# Patient Record
Sex: Male | Born: 1970 | Race: Black or African American | Hispanic: No | Marital: Single | Smoking: Former smoker
Health system: Southern US, Community
[De-identification: ages and names within clinical notes are randomized; demographics above are authoritative.]

---

## 1998-08-28 ENCOUNTER — Emergency Department (HOSPITAL_COMMUNITY): Admission: EM | Admit: 1998-08-28 | Discharge: 1998-08-28 | Payer: Self-pay | Admitting: Emergency Medicine

## 1998-08-28 ENCOUNTER — Encounter: Payer: Self-pay | Admitting: Emergency Medicine

## 1998-09-08 ENCOUNTER — Emergency Department (HOSPITAL_COMMUNITY): Admission: EM | Admit: 1998-09-08 | Discharge: 1998-09-08 | Payer: Self-pay | Admitting: Emergency Medicine

## 2002-07-11 ENCOUNTER — Emergency Department (HOSPITAL_COMMUNITY): Admission: EM | Admit: 2002-07-11 | Discharge: 2002-07-11 | Payer: Self-pay | Admitting: *Deleted

## 2011-04-29 ENCOUNTER — Inpatient Hospital Stay (INDEPENDENT_AMBULATORY_CARE_PROVIDER_SITE_OTHER)
Admission: RE | Admit: 2011-04-29 | Discharge: 2011-04-29 | Disposition: A | Discharge status: Home / Self Care | Payer: Self-pay | Source: Ambulatory Visit | Attending: Emergency Medicine | Admitting: Emergency Medicine

## 2011-04-29 DIAGNOSIS — M549 Dorsalgia, unspecified: Secondary | ICD-10-CM

## 2011-04-29 DIAGNOSIS — M542 Cervicalgia: Secondary | ICD-10-CM

## 2011-07-28 ENCOUNTER — Emergency Department (INDEPENDENT_AMBULATORY_CARE_PROVIDER_SITE_OTHER)
Admission: EM | Admit: 2011-07-28 | Discharge: 2011-07-28 | Disposition: A | Discharge status: Home / Self Care | Payer: Self-pay | Source: Home / Self Care | Attending: Family Medicine | Admitting: Family Medicine

## 2011-07-28 ENCOUNTER — Encounter: Payer: Self-pay | Admitting: Emergency Medicine

## 2011-07-28 DIAGNOSIS — J069 Acute upper respiratory infection, unspecified: Secondary | ICD-10-CM

## 2011-07-28 DIAGNOSIS — H109 Unspecified conjunctivitis: Secondary | ICD-10-CM

## 2011-07-28 MED ORDER — TOBRAMYCIN 0.3 % OP SOLN: 1.0000 [drp] | Freq: Four times a day (QID) | OPHTHALMIC | Status: AC

## 2011-07-28 MED ORDER — IPRATROPIUM BROMIDE 0.06 % NA SOLN: 2.0000 | Freq: Four times a day (QID) | NASAL | Status: DC

## 2011-07-28 NOTE — ED Provider Notes (Signed)
History     CSN: [id number]  Arrival date & time 07/28/11  1035   First MD Initiated Contact with Patient 07/28/11 1046      Chief Complaint  Patient presents with  . Eye Pain    (Consider location/radiation/quality/duration/timing/severity/associated sxs/prior treatment) Patient is a 40 y.o. male presenting with eye pain. The history is provided by the patient.  Eye Pain This is a new problem. The current episode started 2 days ago. The problem occurs constantly. The problem has been gradually worsening. Associated symptoms comments: Nasal congestion. Treatments tried: used sustaine eye drops. The treatment provided no relief.    History reviewed. No pertinent past medical history.  History reviewed. No pertinent past surgical history.  No family history on file.  History  Substance Use Topics  . Smoking status: Current Everyday Smoker  . Smokeless tobacco: Not on file  . Alcohol Use: Yes      Review of Systems  Constitutional: Negative for fever.  HENT: Positive for congestion, rhinorrhea and postnasal drip. Negative for sore throat.   Eyes: Positive for discharge and redness. Negative for photophobia and pain.  Respiratory: Negative.     Allergies  Review of patient's allergies indicates no known allergies.  Home Medications   Current Outpatient Rx  Name Route Sig Dispense Refill  . IPRATROPIUM BROMIDE 0.06 % NA SOLN Nasal Place 2 sprays into the nose 4 (four) times daily. 15 mL 1  . TOBRAMYCIN SULFATE 0.3 % OP SOLN Both Eyes Place 1 drop into both eyes every 6 (six) hours. 5 mL 0    BP 118/75  Pulse 67  Temp(Src) 98.4 F (36.9 C) (Oral)  Resp 16  SpO2 99%  Physical Exam  Nursing note and vitals reviewed. Constitutional: He appears well-developed and well-nourished.  HENT:  Head: Normocephalic.  Right Ear: External ear normal.  Left Ear: External ear normal.  Mouth/Throat: Oropharynx is clear and moist.  Eyes: EOM and lids are normal. Pupils are  equal, round, and reactive to light. Right eye exhibits no discharge and no exudate. Left eye exhibits no discharge and no exudate. Right conjunctiva is injected. Right conjunctiva has no hemorrhage. Left conjunctiva is injected. Left conjunctiva has no hemorrhage.    ED Course  Procedures (including critical care time)  Labs Reviewed - No data to display No results found.   1. URI (upper respiratory infection)   2. Conjunctivitis of both eyes       MDM          Barkley Bruns, MD 07/28/11 1119

## 2011-07-28 NOTE — ED Notes (Signed)
Red eyes, initially right eye, now both.  Denies uri symptoms.  Eye redness started 07-25-11

## 2012-02-07 ENCOUNTER — Encounter (HOSPITAL_COMMUNITY): Payer: Self-pay

## 2012-02-07 ENCOUNTER — Emergency Department (INDEPENDENT_AMBULATORY_CARE_PROVIDER_SITE_OTHER)
Admission: EM | Admit: 2012-02-07 | Discharge: 2012-02-07 | Disposition: A | Discharge status: Home / Self Care | Payer: Self-pay | Source: Home / Self Care | Attending: Emergency Medicine | Admitting: Emergency Medicine

## 2012-02-07 DIAGNOSIS — J029 Acute pharyngitis, unspecified: Secondary | ICD-10-CM

## 2012-02-07 LAB — POCT RAPID STREP A: Streptococcus, Group A Screen (Direct): NEGATIVE

## 2012-02-07 MED ORDER — PREDNISONE 5 MG PO KIT: 1.0000 | PACK | Freq: Every day | ORAL | Status: DC

## 2012-02-07 MED ORDER — TRAMADOL HCL 50 MG PO TABS: 100.0000 mg | ORAL_TABLET | Freq: Three times a day (TID) | ORAL | Status: DC | PRN

## 2012-02-07 MED ORDER — NAPROXEN 500 MG PO TABS: 500.0000 mg | ORAL_TABLET | Freq: Two times a day (BID) | ORAL | Status: DC

## 2012-02-07 NOTE — ED Notes (Signed)
C/o sore throat since yesterday.  States it only hurts on the lt side and hurts up into his lt ear

## 2012-02-07 NOTE — ED Provider Notes (Signed)
Chief Complaint  Patient presents with  . Sore Throat    History of Present Illness:   Samuel Cole is a 41 year old male who has a three-day history of severe sore throat, pain on swallowing, pain radiating towards his left ear and left neck, swelling in the left side of the neck, chills, has felt feverish, had sweats, nasal congestion, and rhinorrhea. He's had a cough which is mostly dry but sometimes productive of small amounts of white sputum. He is a cigarette smoker.  Review of Systems:  Other than as noted above, the patient denies any of the following symptoms. Systemic:  No fever, chills, sweats, fatigue, myalgias, headache, or anorexia. Eye:  No redness, pain or drainage. ENT:  No earache, ear congestion, nasal congestion, sneezing, rhinorrhea, sinus pressure, sinus pain, or post nasal drip. Lungs:  No cough, sputum production, wheezing, shortness of breath, or chest pain. GI:  No abdominal pain, nausea, vomiting, or diarrhea. Skin:  No rash or itching.  PMFSH:  Past medical history, family history, social history, meds, allergies, and nurse's notes were reviewed.  There is no known exposure to strep or mono.  No prior history of step or mono.  The patient denies use of tobacco.  Physical Exam:   Vital signs:  BP 152/80  Pulse 76  Temp 100 F (37.8 C) (Oral)  Resp 20  SpO2 99% General:  Alert, in no distress. Eye:  No conjunctival injection or drainage. Lids were normal. ENT:  TMs and canals were normal, without erythema or inflammation.  Nasal mucosa was clear and uncongested, without drainage.  Mucous membranes were moist.  Exam of pharynx shows tonsils to be enlarged and red, more on the left than the right, without any exudate or displacement of the uvula.  There were no oral ulcerations or lesions. Neck:  Supple, no adenopathy, tenderness or mass. Lungs:  No respiratory distress.  Lungs were clear to auscultation, without wheezes, rales or rhonchi.  Breath sounds were clear and  equal bilaterally.  Heart:  Regular rhythm, without gallops, murmers or rubs. Skin:  Clear, warm, and dry, without rash or lesions.  Labs:   Results for orders placed during the hospital encounter of 02/07/12  POCT RAPID STREP A ([facility] ONLY)      Component Value Range   Streptococcus, Group A Screen (Direct) NEGATIVE  NEGATIVE    Assessment:  The encounter diagnosis was Pharyngitis.  Plan:   1.  The following meds were prescribed:   New Prescriptions   NAPROXEN (NAPROSYN) 500 MG TABLET    Take 1 tablet (500 mg total) by mouth 2 (two) times daily.   PREDNISONE 5 MG KIT    Take 1 kit (5 mg total) by mouth daily after breakfast. Prednisone 5 mg 6 day dosepack.  Take as directed.   TRAMADOL (ULTRAM) 50 MG TABLET    Take 2 tablets (100 mg total) by mouth every 8 (eight) hours as needed for pain.   2.  The patient was instructed in symptomatic care including hot saline gargles, throat lozenges, infectious precautions, and need to trade out toothbrush. Handouts were given. 3.  The patient was told to return if becoming worse in any way, if no better in 3 or 4 days, and given some red flag symptoms that would indicate earlier return.    Samuel Likes, MD 02/07/12 1323

## 2012-02-08 ENCOUNTER — Emergency Department (HOSPITAL_COMMUNITY): Payer: Self-pay

## 2012-02-08 ENCOUNTER — Inpatient Hospital Stay (HOSPITAL_COMMUNITY)
Admission: EM | Admit: 2012-02-08 | Discharge: 2012-02-14 | DRG: 152 | Disposition: A | Discharge status: Home / Self Care | Payer: Self-pay | Attending: Internal Medicine | Admitting: Internal Medicine

## 2012-02-08 ENCOUNTER — Encounter (HOSPITAL_COMMUNITY): Payer: Self-pay

## 2012-02-08 ENCOUNTER — Encounter (HOSPITAL_COMMUNITY): Payer: Self-pay | Admitting: Anesthesiology

## 2012-02-08 DIAGNOSIS — J9600 Acute respiratory failure, unspecified whether with hypoxia or hypercapnia: Secondary | ICD-10-CM | POA: Diagnosis present

## 2012-02-08 DIAGNOSIS — F1210 Cannabis abuse, uncomplicated: Secondary | ICD-10-CM | POA: Diagnosis present

## 2012-02-08 DIAGNOSIS — J189 Pneumonia, unspecified organism: Secondary | ICD-10-CM

## 2012-02-08 DIAGNOSIS — J9601 Acute respiratory failure with hypoxia: Secondary | ICD-10-CM

## 2012-02-08 DIAGNOSIS — J0510 Acute epiglottitis without obstruction: Principal | ICD-10-CM

## 2012-02-08 DIAGNOSIS — J029 Acute pharyngitis, unspecified: Secondary | ICD-10-CM | POA: Diagnosis present

## 2012-02-08 DIAGNOSIS — F17200 Nicotine dependence, unspecified, uncomplicated: Secondary | ICD-10-CM | POA: Diagnosis present

## 2012-02-08 DIAGNOSIS — E46 Unspecified protein-calorie malnutrition: Secondary | ICD-10-CM

## 2012-02-08 LAB — LACTIC ACID, PLASMA: Lactic Acid, Venous: 3 mmol/L — ABNORMAL HIGH (ref 0.5–2.2)

## 2012-02-08 LAB — CBC
MCH: 32.1 pg (ref 26.0–34.0)
MCHC: 35.1 g/dL (ref 30.0–36.0)
Platelets: 235 10*3/uL (ref 150–400)

## 2012-02-08 LAB — POCT I-STAT 3, ART BLOOD GAS (G3+)
TCO2: 24 mmol/L (ref 0–100)
pCO2 arterial: 43.9 mmHg (ref 35.0–45.0)
pH, Arterial: 7.316 — ABNORMAL LOW (ref 7.350–7.450)

## 2012-02-08 LAB — BASIC METABOLIC PANEL
Calcium: 9.6 mg/dL (ref 8.4–10.5)
GFR calc non Af Amer: >90 mL/min (ref >90)
Glucose, Bld: 172 mg/dL — ABNORMAL HIGH (ref 70–99)
Sodium: 136 mEq/L (ref 135–145)

## 2012-02-08 LAB — PROTIME-INR: Prothrombin Time: 14.6 seconds (ref 11.6–15.2)

## 2012-02-08 MED ORDER — MIDAZOLAM HCL 2 MG/2ML IJ SOLN: 3.0000 mg | Freq: Once | INTRAMUSCULAR | Status: DC

## 2012-02-08 MED ORDER — DEXTROSE 5 % IV SOLN
1.0000 g | Freq: Once | INTRAVENOUS | Status: AC
  Administered 2012-02-08: 1 g via INTRAVENOUS
  Filled 2012-02-08: qty 10

## 2012-02-08 MED ORDER — DEXTROSE 5 % IV SOLN
500.0000 mg | INTRAVENOUS | Status: DC
  Administered 2012-02-09 – 2012-02-13 (×5): 500 mg via INTRAVENOUS
  Filled 2012-02-08 (×6): qty 500

## 2012-02-08 MED ORDER — HEPARIN SODIUM (PORCINE) 5000 UNIT/ML IJ SOLN
5000.0000 [IU] | Freq: Three times a day (TID) | INTRAMUSCULAR | Status: DC
  Administered 2012-02-09 – 2012-02-14 (×15): 5000 [IU] via SUBCUTANEOUS
  Filled 2012-02-08 (×19): qty 1

## 2012-02-08 MED ORDER — MIDAZOLAM HCL 2 MG/2ML IJ SOLN
INTRAMUSCULAR | Status: AC
  Administered 2012-02-08: 3 mg via INTRAVENOUS
  Filled 2012-02-08: qty 4

## 2012-02-08 MED ORDER — RACEPINEPHRINE HCL 2.25 % IN NEBU
0.5000 mL | INHALATION_SOLUTION | Freq: Once | RESPIRATORY_TRACT | Status: AC
  Administered 2012-02-08: 0.5 mL via RESPIRATORY_TRACT

## 2012-02-08 MED ORDER — PROPOFOL 10 MG/ML IV EMUL
5.0000 ug/kg/min | INTRAVENOUS | Status: DC
  Administered 2012-02-08: 8 ug/kg/min via INTRAVENOUS
  Administered 2012-02-08: 5 ug/kg/min via INTRAVENOUS
  Filled 2012-02-08 (×3): qty 100

## 2012-02-08 MED ORDER — RACEPINEPHRINE HCL 2.25 % IN NEBU
INHALATION_SOLUTION | RESPIRATORY_TRACT | Status: AC
  Administered 2012-02-08: 18:00:00
  Filled 2012-02-08: qty 0.5

## 2012-02-08 MED ORDER — ETOMIDATE 2 MG/ML IV SOLN
INTRAVENOUS | Status: AC
  Administered 2012-02-08: 30 mg
  Filled 2012-02-08: qty 20

## 2012-02-08 MED ORDER — METHYLPREDNISOLONE SODIUM SUCC 125 MG IJ SOLR
INTRAMUSCULAR | Status: AC
  Administered 2012-02-08: 18:00:00
  Filled 2012-02-08: qty 2

## 2012-02-08 MED ORDER — LIDOCAINE HCL (CARDIAC) 20 MG/ML IV SOLN
INTRAVENOUS | Status: AC
  Administered 2012-02-08: 100 mg
  Filled 2012-02-08: qty 5

## 2012-02-08 MED ORDER — PROPOFOL 10 MG/ML IV EMUL
5.0000 ug/kg/min | INTRAVENOUS | Status: DC
  Administered 2012-02-09 (×2): 20 ug/kg/min via INTRAVENOUS
  Administered 2012-02-09: 10 ug/kg/min via INTRAVENOUS
  Administered 2012-02-09: 20 ug/kg/min via INTRAVENOUS
  Administered 2012-02-10: 50 ug/kg/min via INTRAVENOUS
  Administered 2012-02-10: 35 ug/kg/min via INTRAVENOUS
  Administered 2012-02-10: 50 ug/kg/min via INTRAVENOUS
  Administered 2012-02-10: 25 ug/kg/min via INTRAVENOUS
  Administered 2012-02-10: 20 ug/kg/min via INTRAVENOUS
  Administered 2012-02-11: 50 ug/kg/min via INTRAVENOUS
  Administered 2012-02-11: 25 ug/kg/min via INTRAVENOUS
  Administered 2012-02-11: 50 ug/kg/min via INTRAVENOUS
  Administered 2012-02-11 – 2012-02-12 (×2): 25 ug/kg/min via INTRAVENOUS
  Filled 2012-02-08 (×8): qty 100

## 2012-02-08 MED ORDER — ALBUTEROL SULFATE HFA 108 (90 BASE) MCG/ACT IN AERS: 4.0000 | INHALATION_SPRAY | RESPIRATORY_TRACT | Status: DC | PRN

## 2012-02-08 MED ORDER — CLINDAMYCIN PHOSPHATE 600 MG/4ML IJ SOLN
INTRAMUSCULAR | Status: AC
  Filled 2012-02-08: qty 4

## 2012-02-08 MED ORDER — FENTANYL CITRATE 0.05 MG/ML IJ SOLN
50.0000 ug | INTRAMUSCULAR | Status: DC | PRN
  Administered 2012-02-09 – 2012-02-12 (×8): 100 ug via INTRAVENOUS
  Filled 2012-02-08 (×10): qty 2

## 2012-02-08 MED ORDER — CLINDAMYCIN PHOSPHATE 600 MG/50ML IV SOLN
600.0000 mg | Freq: Once | INTRAVENOUS | Status: AC
  Administered 2012-02-08: 600 mg via INTRAVENOUS

## 2012-02-08 MED ORDER — METHYLPREDNISOLONE SODIUM SUCC 125 MG IJ SOLR: 125.0000 mg | Freq: Once | INTRAMUSCULAR | Status: DC

## 2012-02-08 MED ORDER — FENTANYL CITRATE 0.05 MG/ML IJ SOLN
INTRAMUSCULAR | Status: AC
  Filled 2012-02-08: qty 2

## 2012-02-08 MED ORDER — SUCCINYLCHOLINE CHLORIDE 20 MG/ML IJ SOLN
INTRAMUSCULAR | Status: AC
  Administered 2012-02-08: 125 mg
  Filled 2012-02-08: qty 10

## 2012-02-08 MED ORDER — SODIUM CHLORIDE 0.9 % IV BOLUS (SEPSIS)
2000.0000 mL | Freq: Once | INTRAVENOUS | Status: AC
  Administered 2012-02-08: 1000 mL via INTRAVENOUS

## 2012-02-08 MED ORDER — ETOMIDATE 2 MG/ML IV SOLN: 30.0000 mg | Freq: Once | INTRAVENOUS | Status: DC

## 2012-02-08 MED ORDER — PANTOPRAZOLE SODIUM 40 MG IV SOLR
40.0000 mg | Freq: Every day | INTRAVENOUS | Status: DC
  Administered 2012-02-09 – 2012-02-12 (×4): 40 mg via INTRAVENOUS
  Filled 2012-02-08 (×5): qty 40

## 2012-02-08 MED ORDER — METHYLPREDNISOLONE SODIUM SUCC 125 MG IJ SOLR
60.0000 mg | Freq: Two times a day (BID) | INTRAMUSCULAR | Status: DC
  Administered 2012-02-09 – 2012-02-12 (×9): 60 mg via INTRAVENOUS
  Filled 2012-02-08 (×4): qty 0.96
  Filled 2012-02-08: qty 2
  Filled 2012-02-08 (×6): qty 0.96

## 2012-02-08 MED ORDER — ROCURONIUM BROMIDE 50 MG/5ML IV SOLN
INTRAVENOUS | Status: AC
  Filled 2012-02-08: qty 2

## 2012-02-08 MED ORDER — SODIUM CHLORIDE 0.9 % IV BOLUS (SEPSIS)
1000.0000 mL | Freq: Once | INTRAVENOUS | Status: AC
  Administered 2012-02-08: 1000 mL via INTRAVENOUS

## 2012-02-08 MED ORDER — DEXTROSE 5 % IV SOLN
500.0000 mg | Freq: Once | INTRAVENOUS | Status: AC
  Administered 2012-02-08: 500 mg via INTRAVENOUS
  Filled 2012-02-08: qty 500

## 2012-02-08 MED ORDER — SUCCINYLCHOLINE CHLORIDE 20 MG/ML IJ SOLN
1.5000 mg/kg | Freq: Once | INTRAMUSCULAR | Status: DC
  Filled 2012-02-08: qty 5.96

## 2012-02-08 MED ORDER — SODIUM CHLORIDE 0.9 % IV SOLN
250.0000 mL | INTRAVENOUS | Status: DC | PRN
  Administered 2012-02-09: 1000 mL via INTRAVENOUS

## 2012-02-08 MED ORDER — DEXTROSE 5 % IV SOLN
INTRAVENOUS | Status: AC
  Filled 2012-02-08: qty 50

## 2012-02-08 MED ORDER — PROPOFOL 10 MG/ML IV EMUL: 5.0000 ug/kg/min | INTRAVENOUS | Status: DC

## 2012-02-08 NOTE — ED Notes (Signed)
Pt intubated and sats 98%.

## 2012-02-08 NOTE — ED Notes (Signed)
Pt back to ER in room Trauma B

## 2012-02-08 NOTE — ED Notes (Signed)
Dr Lew Dawes / Fonnie Jarvis request to go to ct. Ritter at bedside for transport. Trach and Intubation called for bedside. Nurse and RT travel with pt. Pt with increase in respiratory  distress RT at bedside. Bednar called to CT scan pt diaphoretic and SpO2 65 prepare to intubate.

## 2012-02-08 NOTE — ED Notes (Signed)
Pt calls out stating increase in shortness of breath. Dr Lew Dawes and Dr Fonnie Jarvis called to bedside. Resp called to bedside stat. Pt in tripod position diaphoretic. SpO2 100.

## 2012-02-08 NOTE — Progress Notes (Signed)
Called to bedside emergently for intubation of pt with suspected glottic swelling after attempt was made to visualize glottis in CT scan as pt became more stridorous.  Ventilated with mask with 100% O2, pt breathing but somnolent.   Glidescope to visualize glottis after Lidocaine 100 mg given IV.  Small opening seen with massive swelling of all tissue folds around vocal cords. 8.0 mm ETT passed with rotation through opening.  Pt coughed and there was immediate frothy red/pink return through the tube.  +CO2 by EZ Cap and BBS were heard.  Ventilation and sedation were turned over to the ED Staff.

## 2012-02-08 NOTE — ED Notes (Signed)
Sister to bedside and wants to remain at bedside with patient

## 2012-02-08 NOTE — ED Notes (Signed)
Suctioning--satss 93%.  Attempting intubation with use of Boogie.  Unsuccessful.  Continuing BVM ventilations

## 2012-02-08 NOTE — ED Provider Notes (Addendum)
History     CSN: [id number]  Arrival date & time 02/08/12  1519   First MD Initiated Contact with Patient 02/08/12 1524      Chief Complaint  Patient presents with  . Shortness of Breath    (Consider location/radiation/quality/duration/timing/severity/associated sxs/prior treatment) Patient is a 41 y.o. male presenting with shortness of breath. The history is provided by the patient.  Shortness of Breath  The current episode started today. The problem occurs frequently. The problem has been unchanged. The problem is moderate. Nothing relieves the symptoms. The symptoms are aggravated by a supine position. Associated symptoms include sore throat, cough and shortness of breath. Pertinent negatives include no chest pain, no chest pressure, no orthopnea, no fever, no rhinorrhea and no wheezing. There was no intake of a foreign body. He has not inhaled smoke recently. He has had no prior hospitalizations. He has had no prior ICU admissions. He has had no prior intubations. He has been behaving normally. Urine output has been normal. Recently, medical care has been given at this facility. Services received include medications given and tests performed.    History reviewed. No pertinent past medical history.  History reviewed. No pertinent past surgical history.  History reviewed. No pertinent family history.  History  Substance Use Topics  . Smoking status: Current Everyday Smoker -- 1.0 packs/day  . Smokeless tobacco: Not on file  . Alcohol Use: 0.6 oz/week    1 Cans of beer per week      Review of Systems  Constitutional: Negative for fever, activity change, appetite change and fatigue.  HENT: Positive for sore throat. Negative for congestion, facial swelling, rhinorrhea, trouble swallowing, neck pain, neck stiffness, voice change and sinus pressure.   Eyes: Negative.   Respiratory: Positive for cough and shortness of breath. Negative for choking, chest tightness and wheezing.     Cardiovascular: Negative for chest pain and orthopnea.  Gastrointestinal: Negative for nausea, vomiting and abdominal pain.  Genitourinary: Negative for dysuria, urgency, frequency, hematuria, flank pain and difficulty urinating.  Musculoskeletal: Negative for back pain and gait problem.  Skin: Negative for rash and wound.  Neurological: Negative for facial asymmetry, weakness, numbness and headaches.  Psychiatric/Behavioral: Negative for behavioral problems, confusion and agitation. The patient is not nervous/anxious and is not hyperactive.   All other systems reviewed and are negative.    Allergies  Review of patient's allergies indicates no known allergies.  Home Medications   Current Outpatient Rx  Name Route Sig Dispense Refill  . IBUPROFEN 200 MG PO TABS Oral Take 400 mg by mouth every 6 (six) hours as needed. For pain    . NAPROXEN 500 MG PO TABS Oral Take 500 mg by mouth 2 (two) times daily.    Marland Kitchen PREDNISONE 5 MG PO KIT Oral Take 1 kit by mouth daily after breakfast. Prednisone 5 mg 6 day dosepack.  Take as directed.    Marland Kitchen TRAMADOL HCL 50 MG PO TABS Oral Take 100 mg by mouth every 8 (eight) hours as needed. For pain      BP 135/76  Pulse 122  Temp 98.9 F (37.2 C) (Oral)  Resp 24  SpO2 100%  Physical Exam  Nursing note and vitals reviewed. Constitutional: He is oriented to person, place, and time. He appears well-developed and well-nourished. No distress.  HENT:  Head: Normocephalic and atraumatic.  Right Ear: External ear normal.  Left Ear: External ear normal.  Mouth/Throat: No oropharyngeal exudate, posterior oropharyngeal edema, posterior oropharyngeal erythema or tonsillar  abscesses.  Eyes: Conjunctivae and EOM are normal. Pupils are equal, round, and reactive to light. Right eye exhibits no discharge. Left eye exhibits no discharge.  Neck: Normal range of motion. Neck supple. No JVD present. No tracheal deviation present. No thyromegaly present.  Cardiovascular:  Normal rate, regular rhythm, normal heart sounds and intact distal pulses.  Exam reveals no gallop and no friction rub.   No murmur heard. Pulmonary/Chest: Effort normal and breath sounds normal. No respiratory distress. He has no wheezes. He exhibits no tenderness.  Abdominal: Soft. Bowel sounds are normal. He exhibits no distension. There is no tenderness. There is no rebound and no guarding.  Musculoskeletal: Normal range of motion. He exhibits no edema and no tenderness.  Lymphadenopathy:    He has no cervical adenopathy.  Neurological: He is alert and oriented to person, place, and time. No cranial nerve deficit.  Skin: Skin is warm and dry. No rash noted. He is not diaphoretic. No pallor.  Psychiatric: He has a normal mood and affect. His behavior is normal.    ED Course  INTUBATION Date/Time: 02/08/2012 7:10 PM Performed by: Sherryl Manges Authorized by: Sherryl Manges Consent: Verbal consent not obtained. The procedure was performed in an emergent situation. Patient identity confirmed: hospital-assigned identification number and arm band Time out: Immediately prior to procedure a "time out" was called to verify the correct patient, procedure, equipment, support staff and site/side marked as required. Indications: respiratory failure Intubation method: video-assisted Patient status: paralyzed (RSI) Preoxygenation: BVM Sedatives: etomidate Paralytic: succinylcholine Laryngoscope size: Mac 4 Tube size: 7.0 mm Tube type: cuffed Number of attempts: 3 Ventilation between attempts: BVM Cricoid pressure: no Cords visualized: no Comments: Failed attempt at intubation. It was successful by anesthesia. See their note   (including critical care time)   Labs Reviewed  CBC  BASIC METABOLIC PANEL   No results found.   No diagnosis found.    MDM  41 year old male with a noncontributory past medical history presents with shortness of breath. The patient was here yesterday with  a sore throat. Patient sore throat and feeling ill for the past 3 or 4 days. Patient says over the last 12 hours if having worsening shortness of breath. He denies nausea vomiting chest pain abdominal pain and has had low-grade fevers. Here patient making while sounds with inspiratory but has not musical note consistent with stridor at this point. Patient's pulse was 80 to 90s on my interview. Patient moving air well oxygenation is normal. Concern for possible retropharyngeal abscess or epiglottitis given difficulty breathing recent illness or throat pain. No torticollis or trismus. We'll get CT soft tissue of neck also get routine labs and give the patient fluid we'll monitor oxygenation and cardiac activity on monitors. At this time patient's otherwise stable.  Results for orders placed during the hospital encounter of 02/08/12  CBC      Component Value Range   WBC 26.7 (*) 4.0 - 10.5 K/uL   RBC 4.74  4.22 - 5.81 MIL/uL   Hemoglobin 15.2  13.0 - 17.0 g/dL   HCT 84.1  32.4 - 40.1 %   MCV 91.4  78.0 - 100.0 fL   MCH 32.1  26.0 - 34.0 pg   MCHC 35.1  30.0 - 36.0 g/dL   RDW 02.7  25.3 - 66.4 %   Platelets 235  150 - 400 K/uL  BASIC METABOLIC PANEL      Component Value Range   Sodium 136  135 - 145 mEq/L  Potassium 3.6  3.5 - 5.1 mEq/L   Chloride 99  96 - 112 mEq/L   CO2 23  19 - 32 mEq/L   Glucose, Bld 172 (*) 70 - 99 mg/dL   BUN 13  6 - 23 mg/dL   Creatinine, Ser 8.11  0.50 - 1.35 mg/dL   Calcium 9.6  8.4 - 91.4 mg/dL   GFR calc non Af Amer >90  >90 mL/min   GFR calc Af Amer >90  >90 mL/min  PRO B NATRIURETIC PEPTIDE      Component Value Range   Pro B Natriuretic peptide (BNP) 212.6 (*) 0 - 125 pg/mL  PROTIME-INR      Component Value Range   Prothrombin Time 14.6  11.6 - 15.2 seconds   INR 1.12  0.00 - 1.49  LACTIC ACID, PLASMA      Component Value Range   Lactic Acid, Venous 3.0 (*) 0.5 - 2.2 mmol/L  POCT I-STAT 3, BLOOD GAS (G3+)      Component Value Range   pH, Arterial 7.316  (*) 7.350 - 7.450   pCO2 arterial 43.9  35.0 - 45.0 mmHg   pO2, Arterial 75.0 (*) 80.0 - 100.0 mmHg   Bicarbonate 22.4  20.0 - 24.0 mEq/L   TCO2 24  0 - 100 mmol/L   O2 Saturation 93.0     Acid-base deficit 4.0 (*) 0.0 - 2.0 mmol/L   Collection site RADIAL, ALLEN'S TEST ACCEPTABLE     Drawn by Operator     Sample type ARTERIAL     DG Chest Portable 1 View (Final result)   Result time:02/08/12 1934    Final result by Rad Results In Interface (02/08/12 19:34:12)    Narrative:   *RADIOLOGY REPORT*  Clinical Data: Shortness of breath. Intubated.  PORTABLE CHEST - 1 VIEW  Comparison: None.  Findings: Endotracheal tube in satisfactory position. Mildly enlarged cardiac silhouette. Airspace opacity throughout the majority of both lungs. Gaseous distention of the stomach. Minimal scoliosis.  IMPRESSION:  1. Endotracheal tube in satisfactory position. 2. Mild cardiomegaly. 3. Diffuse bilateral pneumonia or alveolar edema. 4. Gaseous distention of the stomach.  Original Report Authenticated By: Darrol Angel, M.D.    Patient continues to feel difficulty with breathing but on reevaluation does not have stridor. Patient was moved to the CT scanner then in route started developing audible stridor and had appreciable shortness of breath. Patient was not able tolerate lying down for the CT scanner and started having decreased oxygen saturation. Oxygenation was maintained with bag mask assisted ventilation the patient. RSI was initiated in the CT scanner and a glidescope was used to attempt to intubate the patient. Patient had significant swelling in the back of his throat seen during laryngoscopy and landmarks were difficult to identify. Difficult to identify epiglottis and could not see cords on 2 different attempts. Could not pass a bougie. Patient was easily ventilated so ENT and anesthesia were consult and to help with airway management. Upon their arrival patient began awaking from RSI.  Patient was revisualized with Glidescope and out he was breathing it was easier to see his airway and he was intubated with a 7-0 tube. Patient was then seen by ENT and admitted to the ICU service after being put on a propofol drip for sedation. It is presumed the patient has some sort of infectious etiology causing swelling in his lower pharynx.  Case discussed with Dr.Bednar        Sherryl Manges, MD 02/08/12 [phone]  Sherryl Manges, MD 02/13/12 1534

## 2012-02-08 NOTE — ED Notes (Signed)
Continuing bag patient and plan for possible intubation or trach placement

## 2012-02-08 NOTE — ED Notes (Signed)
Anesthesia at bedside and attempting to intubate.  Sats 100% with BVM ventilations.

## 2012-02-08 NOTE — ED Notes (Signed)
Dr. Suszanne Conners ENT to bedside and Dr. Fonnie Jarvis speaking about patient case.

## 2012-02-08 NOTE — ED Notes (Signed)
Pt states symptom unresolved but have not worsened.

## 2012-02-08 NOTE — ED Notes (Signed)
Bagging patient and sats up to 100% and continuing to maintain airway.  bp 188/121 and HR 114.  Anesthesia is coming down to see patient now.

## 2012-02-08 NOTE — ED Notes (Signed)
Pt is brought back from CT after attempting to intubate, but unsuccessful and Dr. Fonnie Jarvis remains with patient and calling ENT now.  Anesthesia paged for standby for possible trach insertion or intubation.  Continuing to bag patient with BVM ventilations and sats 92%.

## 2012-02-08 NOTE — ED Notes (Signed)
Anesthesia paged

## 2012-02-08 NOTE — ED Notes (Addendum)
Critical care md in to assess pt.

## 2012-02-08 NOTE — ED Notes (Signed)
Attempting to intubate and sats 98% .  BP 155/88 and HR 71.  Pt has swollen airway.

## 2012-02-08 NOTE — ED Notes (Signed)
Pt went straight to CT with RN and called for possible intubation and sats dropping into the 50s.  Dr. Fonnie Jarvis bagging patient in CT and sats 100%, HR 134, BP---181/97.  Nurses and RT at bedside.

## 2012-02-08 NOTE — ED Notes (Addendum)
Per Ems pt dx with uri 2 days ago. Pt began having sob today ran to office of apartment building called 911. On arrival of fire department pts SpO2 80%. EMS states 100% on 2L [place]. Per EMS pt was in tripod position and diaphoretic.

## 2012-02-08 NOTE — ED Notes (Signed)
Pt remains on vent.  Will answer questions by shaking head.  Family at bedside.  Pt continues to be suctioned off and on.  Pink colored sputum suctioned from endo tube.  Pt has min. Amount of blood coming from right nare.  Admission bed obtained waiting for critical care MD to see pt.  Family updated on plan of care.

## 2012-02-08 NOTE — ED Notes (Signed)
Pt waking up and combative placed NRB on patient and md order Versed.  Pt on monitor with HR in the 70s.  Sats in the 40-50s and continuing to bag patient and sats are improving.  HR 120.  Sats 90% now.  Dr. Fonnie Jarvis speaking with Anesthesiology now.

## 2012-02-08 NOTE — ED Provider Notes (Signed)
I saw and evaluated the patient, reviewed the resident's note and I agree with the findings and plan.  OP clear, uvula midline, no drool/stridor, no neck LAN, L:CTA, RA sat 100%, but sensation of sore throat with odynophagia few days CT ordered.  ECG: Normal sinus rhythm, ventricular rate 89, normal axis, normal intervals, nonspecific ST-T changes with no comparison ECG available  Just prior to CT attempt Pt developed mild stridor and resp distress but L:CTA and RA sat 100% with pulse stable ~100, decision made to have Pt attempt CT with ED staff present in case of decompensation, Pt attempted to lie flat in CT dropped O2 sats to 50% briefly, became poorly responsive with severe resp distress, CT not attempted, BVM assisted resps sat improved to 100%, time-out taken, RSI attempt in CT video-laryngoscopy performed visualized suspected swollen glottis but due to distorted anatomy ETT/bougie not attempted to pass, Pt BVM with sats 97-100% moved back to main ED, ENT and Anesth called and arrived, Pt began to wake up and sats dropped again briefly so sedated with Versed and BVM assisted resps and sats high 90s, Anesth obtained same view as ED with Glidescope successfully attempted ETT passage (single attempt), positive ETCO2, maintained sats >90% during intubation, ENT present and Cric kit open and ready by EDP, family present during procedure.  EDP Procedures:Video-laryngoscopy, mechanical ventilation, critical care  Case d/w Anesth, ENT, Crit Care, family  CRITICAL CARE Performed by: Hurman Horn   Total critical care time:  Critical care time was exclusive of separately billable procedures and treating other patients.  Critical care was necessary to treat or prevent imminent or life-threatening deterioration.  Critical care was time spent personally by me on the following activities: development of treatment plan with patient and/or surrogate as well as nursing, discussions with  consultants, evaluation of patient's response to treatment, examination of patient, obtaining history from patient or surrogate, ordering and performing treatments and interventions, ordering and review of laboratory studies, ordering and review of radiographic studies, pulse oximetry and re-evaluation of patient's condition.  Hurman Horn, MD 02/10/12 1315

## 2012-02-08 NOTE — ED Notes (Signed)
Anesthesia attempting intubation now.

## 2012-02-08 NOTE — ED Notes (Signed)
Bagging patient with BVM and sats 96%.

## 2012-02-08 NOTE — Consult Note (Signed)
Reason for Consult: Respiratory distress Referring Physician: Dr. Fonnie Jarvis  HPI:  Samuel Cole is an 41 y.o. male who presents to the [facility] emergency room today complaining of shortness of breath and sore throat. The patient was transported to the emergency room by an ambulance, after the patient called 911 secondary to shortness of breath. According to the sister, the patient has been experiencing sore throat for the past 3 days. It has progressively worsened. While in the CT suite, the patient decompensated, and became unresponsive. Initial attempts to intubate the patient was unsuccessful secondary to severe laryngeal edema. The patient was finally intubated on second attempt. The patient has no previous history of head and neck pathology. He has no previous history of ENT surgery. The sister reports that the patient's immunizations are all up-to-date. No previous history of significant tonsillitis or epiglottitis. The patient was previously healthy.  History reviewed. No pertinent past medical history.  History reviewed. No pertinent past surgical history.  History reviewed. No pertinent family history.  Social History:  reports that he has been smoking.  He does not have any smokeless tobacco history on file. He reports that he drinks about .6 ounces of alcohol per week. He reports that he uses illicit drugs (Marijuana) about once per week.  Allergies: No Known Allergies  Medications: I have reviewed the patient's current medications. Prior to Admission:  (Not in a hospital admission)  Results for orders placed during the hospital encounter of 02/08/12 (from the past 48 hour(s))  CBC     Status: Abnormal   Collection Time   02/08/12  3:59 PM      Component Value Range Comment   WBC 26.7 (*) 4.0 - 10.5 K/uL    RBC 4.74  4.22 - 5.81 MIL/uL    Hemoglobin 15.2  13.0 - 17.0 g/dL    HCT 16.1  09.6 - 04.5 %    MCV 91.4  78.0 - 100.0 fL    MCH 32.1  26.0 - 34.0 pg    MCHC 35.1  30.0 -  36.0 g/dL    RDW 40.9  81.1 - 91.4 %    Platelets 235  150 - 400 K/uL   BASIC METABOLIC PANEL     Status: Abnormal   Collection Time   02/08/12  3:59 PM      Component Value Range Comment   Sodium 136  135 - 145 mEq/L    Potassium 3.6  3.5 - 5.1 mEq/L    Chloride 99  96 - 112 mEq/L    CO2 23  19 - 32 mEq/L    Glucose, Bld 172 (*) 70 - 99 mg/dL    BUN 13  6 - 23 mg/dL    Creatinine, Ser 7.82  0.50 - 1.35 mg/dL    Calcium 9.6  8.4 - 95.6 mg/dL    GFR calc non Af Amer >90  >90 mL/min    GFR calc Af Amer >90  >90 mL/min     Blood pressure 115/86, pulse 119, temperature 98.9 F (37.2 C), temperature source Oral, resp. rate 22, weight 79.379 kg (175 lb), SpO2 98.00%.  Physical examination: The patient is intubated and unresponsive. He appears well-nourished and well-developed. Eye: No conjunctival injection or drainage. Lids were normal. His pupils are equal, round, reactive to light. Extraocular motion cannot be determined. Examination of the ears shows normal auricles and external auditory canals bilaterally. Nasal examination shows normal mucosa, septum, turbinates. Facial examination shows no asymmetry. Oral cavity examination  shows no mucosal lacerations. Uvula midline. Oropharyngeal erythema/edema. Palpation of the neck reveals no significant lymphadenopathy or mass. The trachea is midline. The thyroid is not significantly enlarged. Cranial nerves could not be ascertained.  Skin: Skin is warm and dry. No rash noted. He is not diaphoretic. No pallor.   Procedure:  Flexible Fiberoptic Laryngoscopy Anesthesia: None Indication: Respiratory distress, laryngeal and epiglottic swelling. Description:  The nasal cavities were decongested and anesthetised with a combination of oxymetazoline and 4% lidocaine solution.  The flexible scope was inserted into the right nasal cavity and advanced towards the nasopharynx.  Visualized mucosa over the turbinates and septum were normal.  The nasopharynx was  clear.  Oropharyngeal walls edematous and erythematous. Epiglottis and the entire larynx is diffusely edematous and erythematous. ET tube was in place.  Assessment/Plan: Acute pharyngitis with epiglottitis and severe laryngeal edema. Pt now intubated. Recommend broad spectrum antibiotic coverage and IV steroid.  Will follow.  Arvella Massingale,SUI W 02/08/2012, 7:00 PM

## 2012-02-08 NOTE — ED Notes (Signed)
Family at bedside pt will open eyes and answer questions appropriately by shaking his head.  NS bolus infusing at this time.

## 2012-02-08 NOTE — ED Notes (Signed)
Pt arrives c/o sob related to sore throat. Pt has small amount of stridor noted. Controlling own sections. Thick white coating noted to tongue. Pt able to speak 3-5 word answers. Voice with deep thick sound. Dr. Lew Dawes at bedside. Skin warm and dry. Pt sitting in semi fowlers position without difficulty.

## 2012-02-08 NOTE — ED Notes (Signed)
Bagging patient

## 2012-02-09 ENCOUNTER — Inpatient Hospital Stay (HOSPITAL_COMMUNITY): Payer: Self-pay

## 2012-02-09 DIAGNOSIS — J189 Pneumonia, unspecified organism: Secondary | ICD-10-CM

## 2012-02-09 DIAGNOSIS — J9601 Acute respiratory failure with hypoxia: Secondary | ICD-10-CM

## 2012-02-09 DIAGNOSIS — J9600 Acute respiratory failure, unspecified whether with hypoxia or hypercapnia: Secondary | ICD-10-CM

## 2012-02-09 DIAGNOSIS — J0510 Acute epiglottitis without obstruction: Secondary | ICD-10-CM

## 2012-02-09 LAB — MRSA PCR SCREENING: MRSA by PCR: NEGATIVE

## 2012-02-09 LAB — BASIC METABOLIC PANEL
Calcium: 8.2 mg/dL — ABNORMAL LOW (ref 8.4–10.5)
GFR calc non Af Amer: >90 mL/min (ref >90)
Glucose, Bld: 249 mg/dL — ABNORMAL HIGH (ref 70–99)
Sodium: 133 mEq/L — ABNORMAL LOW (ref 135–145)

## 2012-02-09 LAB — BLOOD GAS, ARTERIAL
Acid-base deficit: 5.3 mmol/L — ABNORMAL HIGH (ref 0.0–2.0)
Bicarbonate: 19 mEq/L — ABNORMAL LOW (ref 20.0–24.0)
FIO2: 0.4 %
MECHVT: 530 mL
TCO2: 20 mmol/L (ref 0–100)
pCO2 arterial: 34.5 mmHg — ABNORMAL LOW (ref 35.0–45.0)

## 2012-02-09 LAB — CBC
MCH: 32 pg (ref 26.0–34.0)
Platelets: 228 10*3/uL (ref 150–400)
Platelets: 211 10*3/uL (ref 150–400)
RBC: 4.81 MIL/uL (ref 4.22–5.81)
RBC: 4.37 MIL/uL (ref 4.22–5.81)
RDW: 12.3 % (ref 11.5–15.5)
WBC: 32.5 10*3/uL — ABNORMAL HIGH (ref 4.0–10.5)
WBC: 28.5 10*3/uL — ABNORMAL HIGH (ref 4.0–10.5)

## 2012-02-09 LAB — GLUCOSE, CAPILLARY: Glucose-Capillary: 176 mg/dL — ABNORMAL HIGH (ref 70–99)

## 2012-02-09 LAB — CREATININE, SERUM
Creatinine, Ser: 0.98 mg/dL (ref 0.50–1.35)
GFR calc Af Amer: >90 mL/min (ref >90)

## 2012-02-09 LAB — HIV ANTIBODY (ROUTINE TESTING W REFLEX): HIV: NONREACTIVE

## 2012-02-09 MED ORDER — MIDAZOLAM HCL 2 MG/2ML IJ SOLN
1.0000 mg | INTRAMUSCULAR | Status: DC | PRN
  Administered 2012-02-08: 3 mg via INTRAVENOUS
  Administered 2012-02-09 (×2): 1 mg via INTRAVENOUS

## 2012-02-09 MED ORDER — IOHEXOL 300 MG/ML  SOLN
75.0000 mL | Freq: Once | INTRAMUSCULAR | Status: AC | PRN
  Administered 2012-02-09: 75 mL via INTRAVENOUS

## 2012-02-09 MED ORDER — MIDAZOLAM HCL 2 MG/2ML IJ SOLN
1.0000 mg | INTRAMUSCULAR | Status: DC | PRN
Start: 2012-02-09 — End: 2012-02-13

## 2012-02-09 MED ORDER — VANCOMYCIN HCL IN DEXTROSE 1-5 GM/200ML-% IV SOLN
1000.0000 mg | Freq: Three times a day (TID) | INTRAVENOUS | Status: DC
  Administered 2012-02-09 – 2012-02-11 (×8): 1000 mg via INTRAVENOUS
  Filled 2012-02-09 (×10): qty 200

## 2012-02-09 MED ORDER — MIDAZOLAM HCL 2 MG/2ML IJ SOLN
INTRAMUSCULAR | Status: AC
  Administered 2012-02-09: 1 mg via INTRAVENOUS
  Filled 2012-02-09: qty 2

## 2012-02-09 MED ORDER — PIPERACILLIN-TAZOBACTAM 3.375 G IVPB 30 MIN
3.3750 g | INTRAVENOUS | Status: AC
  Administered 2012-02-09: 3.375 g via INTRAVENOUS
  Filled 2012-02-09: qty 50

## 2012-02-09 MED ORDER — VANCOMYCIN HCL IN DEXTROSE 1-5 GM/200ML-% IV SOLN
1000.0000 mg | INTRAVENOUS | Status: AC
  Administered 2012-02-09: 1000 mg via INTRAVENOUS
  Filled 2012-02-09: qty 200

## 2012-02-09 MED ORDER — LORAZEPAM 2 MG/ML IJ SOLN
0.5000 mg | INTRAMUSCULAR | Status: DC | PRN
  Administered 2012-02-09 – 2012-02-10 (×3): 1 mg via INTRAVENOUS
  Filled 2012-02-09 (×2): qty 1

## 2012-02-09 MED ORDER — SODIUM CHLORIDE 0.9 % IV BOLUS (SEPSIS)
500.0000 mL | Freq: Once | INTRAVENOUS | Status: AC
  Administered 2012-02-09: 500 mL via INTRAVENOUS

## 2012-02-09 MED ORDER — CHLORHEXIDINE GLUCONATE 0.12 % MT SOLN
OROMUCOSAL | Status: AC
  Filled 2012-02-09: qty 15

## 2012-02-09 MED ORDER — PIPERACILLIN-TAZOBACTAM 3.375 G IVPB
3.3750 g | Freq: Three times a day (TID) | INTRAVENOUS | Status: DC
  Administered 2012-02-09 – 2012-02-12 (×12): 3.375 g via INTRAVENOUS
  Filled 2012-02-09 (×14): qty 50

## 2012-02-09 MED ORDER — BIOTENE DRY MOUTH MT LIQD
15.0000 mL | Freq: Four times a day (QID) | OROMUCOSAL | Status: DC
  Administered 2012-02-09 – 2012-02-13 (×15): 15 mL via OROMUCOSAL

## 2012-02-09 MED ORDER — CHLORHEXIDINE GLUCONATE 0.12 % MT SOLN
15.0000 mL | Freq: Two times a day (BID) | OROMUCOSAL | Status: DC
  Administered 2012-02-09 – 2012-02-12 (×8): 15 mL via OROMUCOSAL
  Filled 2012-02-09 (×8): qty 15

## 2012-02-09 NOTE — Progress Notes (Signed)
Chaplain called to ED for family support.  Sister was in Trauma B with patient, very focused on his care and not needing support.   Within 5 minutes, chaplain found that patient mother & other sister were sitting in 17B.  Chaplain went to give support, encouragement, spiritual conversation, offer comfort measures.  Chaplain accompanied family to visit with patient after intubation.  Mother very emotional.    Chaplain checked in with family throughout evening..and gave encouragement before patient was moved to 2100.  Rev. Audie Box [phone]

## 2012-02-09 NOTE — Progress Notes (Signed)
INITIAL ADULT NUTRITION ASSESSMENT Date: 02/09/2012   Time: 11:15 AM Reason for Assessment: Ventilated pt   ASSESSMENT: Male 41 y.o.  Dx: Shortness of breath  Intervention: Recommend Oxepa start at 35ml/hr increase by 10ml q4hr to goal of 23ml/hr which will provide 1800 calories, 75g protein, and free water. This TF plus current rate of propofol would provide a total of 2051 calories. If IVF d/c recommend water flushes q4hr.   Hx:  History reviewed. No pertinent past medical history.  Related Meds:  Scheduled Meds:   . antiseptic oral rinse  15 mL Mouth Rinse QID  . azithromycin  500 mg Intravenous Once  . azithromycin  500 mg Intravenous Q24H  . cefTRIAXone (ROCEPHIN) IVPB 1 gram/50 mL D5W  1 g Intravenous Once  . chlorhexidine  15 mL Mouth Rinse BID  . clindamycin      . clindamycin (CLEOCIN) IV  600 mg Intravenous Once  . dextrose      . etomidate      . etomidate  30 mg Intravenous Once  . heparin  5,000 Units Subcutaneous Q8H  . lidocaine (cardiac) 100 mg/48ml      . methylPREDNISolone (SOLU-MEDROL) injection  125 mg Intravenous Once  . methylPREDNISolone (SOLU-MEDROL) injection  60 mg Intravenous Q12H  . methylPREDNISolone sodium succinate      . midazolam  3 mg Intravenous Once  . pantoprazole (PROTONIX) IV  40 mg Intravenous QHS  . piperacillin-tazobactam  3.375 g Intravenous NOW  . piperacillin-tazobactam (ZOSYN)  IV  3.375 g Intravenous Q8H  . Racepinephrine HCl  0.5 mL Nebulization Once  . Racepinephrine HCl      . rocuronium      . sodium chloride  1,000 mL Intravenous Once  . sodium chloride  2,000 mL Intravenous Once  . sodium chloride  500 mL Intravenous Once  . succinylcholine      . succinylcholine  1.5 mg/kg Intravenous Once  . vancomycin  1,000 mg Intravenous NOW  . vancomycin  1,000 mg Intravenous Q8H   Continuous Infusions:   . propofol 8 mcg/kg/min (02/08/12 1911)  . propofol 20 mcg/kg/min (02/09/12 0800)  . DISCONTD: propofol      PRN Meds:.sodium chloride, albuterol, fentaNYL, LORazepam, midazolam, DISCONTD: midazolam  Ht: 5\' 10"  (177.8 cm)  Wt: 156 lb 1.4 oz (70.8 kg)  Ideal Wt: 166 lb % Ideal Wt: 94  Usual Wt: 156 lb % Usual Wt: 100  Body mass index is 22.40 kg/(m^2).  Food/Nutrition Related Hx: Pt admitted with 3 day history of severe throat pain and pain with swallowing. Pt developed shortness of breath and became unresponsive requiring intubation. Currently no plans for extubation. Met with pt's sister at bedside who reports pt typically eats well with good appetite and no changes in weight, however pt's intake decline to minimal snacking for the past 3 days r/t pain with swallowing.   MVe = 10.4 L/min Maximum temperature in the past 24 hours of 37.9 degrees Celsius  Propofol running at 9.5 ml/min, provides 251 calories   Labs:  CMP     Component Value Date/Time   NA 133* 02/09/2012 0400   K 4.2 02/09/2012 0400   CL 104 02/09/2012 0400   CO2 19 02/09/2012 0400   GLUCOSE 249* 02/09/2012 0400   BUN 13 02/09/2012 0400   CREATININE 0.97 02/09/2012 0400   CALCIUM 8.2* 02/09/2012 0400   GFRNONAA >90 02/09/2012 0400   GFRAA >90 02/09/2012 0400    Intake/Output Summary (Last 24 hours) at  02/09/12 1119 Last data filed at 02/09/12 0800  Gross per 24 hour  Intake 1517.74 ml  Output    550 ml  Net 967.74 ml   Last BM - PTA  Diet Order:  NPO   IVF:    propofol Last Rate: 8 mcg/kg/min (02/08/12 1911)  propofol Last Rate: 20 mcg/kg/min (02/09/12 0800)  DISCONTD: propofol     Estimated Nutritional Needs:   Kcal: 2004 Protein: 70-85g Fluid: 2L  NUTRITION DIAGNOSIS: -Inadequate oral intake (NI-2.1).  Status: Ongoing  RELATED TO: inability to eat  AS EVIDENCE BY: mechanical ventilation, NPO  MONITORING/EVALUATION(Goals): Enteral nutrition to meet >90% of estimated nutritional needs.   EDUCATION NEEDS: -No education needs identified at this time  INTERVENTION: RN stated pt likely to remain intubated  over the weekend. Recommend Oxepa start at 56ml/hr increase by 10ml q4hr to goal of 20ml/hr which will provide 1800 calories, 75g protein, and free water. This TF plus current rate of propofol would provide a total of 2051 calories. If IVF d/c recommend water flushes q4hr.    Dietitian #: [phone]  DOCUMENTATION CODES Per approved criteria  -Not Applicable    Samuel Cole, Samuel Cole 02/09/2012, 11:15 AM

## 2012-02-09 NOTE — H&P (Addendum)
Name: Samuel Cole MRN: [id number] DOB: 1971-06-03    LOS: 1  PULMONARY / CRITICAL CARE MEDICINE  HPI:   41 year old male with no significant PMH except for marijuana abuse. Presents with 3 to 4 days history of sore throat, non productive cough, odynophagia, fever, chills and malaise. He came to the ED yesterday and was diagnosed with pharyngitis and was sent home on prednisone, naproxen and tramadol. Over the last 24 hrs his symptoms progressed and became SOB with stridor. 911 was called and was brought to the ED on supplemental O2. At admission there was no stridor and was saturating 100% on RA. He was sent for a CT of the neck and while on the CT he developed stridor, hypoxemia and became unconscious. The first intubation attempt in the CT scan was unsuccessful but they were able to ventilate him well with the bag. He was taken back to the ER and was intubated by anesthesia en ENT. Apparently there was severe epiglottis and laryngeal edema with purulent secretions. Once intubated the patient remained hemodynamically stable. At the time of my examination the patient is sedated with propofol but arousable and able to follow commands.    History reviewed. No pertinent past medical history. History reviewed. No pertinent past surgical history. Prior to Admission medications   Medication Sig Start Date End Date Taking? Authorizing Provider  ibuprofen (ADVIL,MOTRIN) 200 MG tablet Take 400 mg by mouth every 6 (six) hours as needed. For pain   Yes Historical Provider, MD  naproxen (NAPROSYN) 500 MG tablet Take 500 mg by mouth 2 (two) times daily. 02/07/12 02/06/13 Yes Reuben Likes, MD  PredniSONE 5 MG KIT Take 1 kit by mouth daily after breakfast. Prednisone 5 mg 6 day dosepack.  Take as directed. 02/07/12  Yes Reuben Likes, MD  traMADol (ULTRAM) 50 MG tablet Take 100 mg by mouth every 8 (eight) hours as needed. For pain 02/07/12 02/17/12 Yes Reuben Likes, MD   Allergies No Known Allergies  Family  History History reviewed. No pertinent family history. Social History  reports that he has been smoking.  He does not have any smokeless tobacco history on file. He reports that he drinks about .6 ounces of alcohol per week. He reports that he uses illicit drugs (Marijuana) about once per week.  Review Of Systems:  Unable to provide.   Vital Signs: Temp:  [98.9 F (37.2 C)-99.9 F (37.7 C)] 99.9 F (37.7 C) (07/06 0100) Pulse Rate:  [82-138] 82  (07/06 0100) Resp:  [22-30] 30  (07/06 0100) BP: (104-188)/(72-121) 104/72 mmHg (07/06 0100) SpO2:  [93 %-100 %] 93 % (07/06 0100) FiO2 (%):  [100 %] 100 % (07/06 0035) Weight:  [175 lb (79.379 kg)] 175 lb (79.379 kg) (07/05 1900)  Physical Examination: General:  Intubated, mechanically ventilated, no acute distress Neuro:  Sedated but arousable and able to follow commands, synchronous, nonfocal HEENT:  PERRL, pink conjunctivae, moist membranes Neck:  Supple, no JVD   Cardiovascular:  RRR, no M/R/G Lungs:  CTA, no W/R/R Abdomen:  Soft, nontender, nondistended, bowel sounds present Musculoskeletal:  Moves all extremities, no pedal edema Skin:  No rash    ASSESSMENT AND PLAN 41 year old male with no significant PMH. Presents with 4 days history of severe sore throat, fever, chills, odynophagia. In the ER he developed stridor and hypoxemia that required intubation for airway protection. Laryngoscopy revealed severely swollen epiglottis and larynx. There were purulent secretions. The chest X ray shows bilateral  infiltrates compatible with pneumonia. Less likely non cardiogenic pulmonary edema. Blood work showed markedly elevated WBC.  PULMONARY  Lab 02/08/12 2000  PHART 7.316*  PCO2ART 43.9  PO2ART 75.0*  HCO3 22.4  O2SAT 93.0   Ventilator Settings: Vent Mode:  [-] PRVC FiO2 (%):  [100 %] 100 % Set Rate:  [16 bmp-18 bmp] 16 bmp Vt Set:  [530 mL] 530 mL PEEP:  [5 cmH20] 5 cmH20 Plateau Pressure:  [16 cmH20-19 cmH20] 16  cmH20 CXR:  Bilateral infiltrates, likely pneumonia but non cardiogenic pulmonary edema also in the differential.  ETT:  7 cm from the carina. Needs to be advanced 3 to 4 cm.  A:   1) Acute respiratory failure 2) Epiglottitis and laryngitis with severe edema compromising the airway. 3) Community acquired pneumonia.  P:   1) Intubated and mechanically ventilated - PRVC, Vt 8cc/kg, PEEP 5, RR: 16, titrate FiO2 to keep O2 sat >94% - VAP prevention orderset. - Zosyn, vancomycin, and azithromycin. - Solumedrol 60 mg IV BID - Trach aspirate cultures, blood cultures, urine cultures. - Respiratory viral panel. - Urine drug screen.  - ENT input appreciated. - Got about 6 L of IV fluids in the ED  CARDIOVASCULAR  Lab 02/08/12 2049 02/08/12 2048  TROPONINI -- --  LATICACIDVEN 3.0* --  PROBNP -- 212.6*   ECG:  Normal sinus rhythm Lines: Peripheral lines  A:  No issues P:  - Consider echocardiogram if persistent fever, positive blood cultures or worsening lung infiltrates.   RENAL  Lab 02/08/12 1559  NA 136  K 3.6  CL 99  CO2 23  BUN 13  CREATININE 0.89  CALCIUM 9.6  MG --  PHOS --   Intake/Output    None    Foley:  Placed today (02/08/12)  A:   Normal kidney function P:   Monitor BMP  GASTROINTESTINAL No results found for this basename: AST:5,ALT:5,ALKPHOS:5,BILITOT:5,PROT:5,ALBUMIN:5 in the last 168 hours  A:   No issues P:   - GI prophylaxis with protonix IV - NPO  HEMATOLOGIC  Lab 02/09/12 0039 02/08/12 2049 02/08/12 1559  HGB 15.4 -- 15.2  HCT 44.3 -- 43.3  PLT 228 -- 235  INR -- 1.12 --  APTT -- -- --   A:   No issues except for elevated WBC P:  - Follow up CBC  INFECTIOUS  Lab 02/09/12 0039 02/08/12 1559  WBC 32.5* 26.7*  PROCALCITON -- --   Cultures: - Blood, urine and tracheal aspirates cultures ordered. Antibiotics: - Zosyn (02/08/12) - Vancomycin (02/08/12) - Azithromycin (02/08/12)  A:   1) Epiglottitis and laryngitis 2)  Community acquired pneumonia P:   - Antibiotics as above - Will follow cultures, RVP, HIV.  ENDOCRINE  Lab 02/09/12 0041  GLUCAP 176*   A:   No issues     NEUROLOGIC  A:   No issues P:   - Continuous sedation with propofol - Fentanyl PRN for ain control - Daily awakening.  BEST PRACTICE / DISPOSITION - Level of Care:  ICU - Primary Service:  CCM - Consultants:  ENT - Code Status:  Full code - Diet:  NPO - DVT Px:  Heparin - GI Px:  Protonix - Skin Integrity:  Intact - Social / Family:  Updated at bedside.  The patient is critically ill with multiple organ systems failure and requires high complexity decision making for assessment and support, frequent evaluation and titration of therapies, application of advanced monitoring technologies and extensive interpretation of multiple databases.  Critical Care Time devoted to patient care services described in this note is: 1 Hour  Overton Mam, M.D. Pulmonary and Critical Care Medicine [facility] Pager: [phone]  02/09/2012, 1:14 AM

## 2012-02-09 NOTE — Progress Notes (Signed)
ANTIBIOTIC CONSULT NOTE - INITIAL  Pharmacy Consult for Vancocin/Zosyn Indication: pneumonia  No Known Allergies  Patient Measurements: Weight: 175 lb (79.379 kg)  Vital Signs: Temp: 98.9 F (37.2 C) (07/05 1526) Temp src: Oral (07/05 1526) BP: 115/86 mmHg (07/05 1858) Pulse Rate: 119  (07/05 1858)  Labs:  Basename 02/08/12 1559  WBC 26.7*  HGB 15.2  PLT 235  LABCREA --  CREATININE 0.89   Microbiology: No results found for this or any previous visit (from the past 720 hour(s)).  Medical History: History reviewed. No pertinent past medical history.  Assessment: 41yo male seen in ED yesterday for pharyngitis c/o worsening SOB, returned today via EMS, became unresponsive in CT with difficulty intubating due to severe laryngeal edema, now to begin IV ABX for PNA.  Goal of Therapy:  Vancomycin trough level 15-20 mcg/ml  Plan:  Will begin vancomycin 1000mg  IV Q8H and Zosyn 3.375g IV Q8H and monitor CBC, Cx, levels prn.  Colleen Can PharmD BCPS 02/09/2012,12:24 AM

## 2012-02-09 NOTE — H&P (Signed)
Name: Samuel Cole MRN: [id number] DOB: 08-Dec-1970    LOS: 1  PULMONARY / CRITICAL CARE MEDICINE  HPI:   41 year old male with no significant PMH except for marijuana abuse. Presents with 3 to 4 days history of sore throat, non productive cough, odynophagia, fever, chills and malaise. He came to the ED yesterday and was diagnosed with pharyngitis and was sent home on prednisone, naproxen and tramadol. Over the last 24 hrs his symptoms progressed and became SOB with stridor. 911 was called and was brought to the ED on supplemental O2. At admission there was no stridor and was saturating 100% on RA. He was sent for a CT of the neck and while on the CT he developed stridor, hypoxemia and became unconscious. The first intubation attempt in the CT scan was unsuccessful but they were able to ventilate him well with the bag. He was taken back to the ER and was intubated by anesthesia and ENT. Apparently there was severe epiglottis and laryngeal edema with purulent secretions. Once intubated the patient remained hemodynamically stable. At the time of my examination the patient is sedated with propofol but arousable and able to follow commands.     Vital Signs: Temp:  [98.9 F (37.2 C)-100.4 F (38 C)] 99.4 F (37.4 C) (07/06 0805) Pulse Rate:  [71-138] 83  (07/06 0805) Resp:  [16-36] 23  (07/06 0805) BP: (88-188)/(59-121) 103/65 mmHg (07/06 0805) SpO2:  [85 %-100 %] 100 % (07/06 0805) FiO2 (%):  [40 %-100 %] 50 % (07/06 0805) Weight:  [156 lb 1.4 oz (70.8 kg)-175 lb (79.379 kg)] 156 lb 1.4 oz (70.8 kg) (07/06 0100)  Physical Examination: General:  Intubated, mechanically ventilated, no acute distress Neuro:  Awake, able to follow commands, synchronous, nonfocal HEENT:  PERRL, pink conjunctivae, moist membranes Neck:  Supple, no JVD   Cardiovascular:  RRR, no M/R/G Lungs:  CTA, no W/R/R Abdomen:  Soft, nontender, nondistended, bowel sounds present Musculoskeletal:  Moves all extremities, no  pedal edema Skin:  No rash    ASSESSMENT AND PLAN 41 year old male with no significant PMH. Presents with 4 days history of severe sore throat, fever, chills, odynophagia. In the ER he developed stridor and hypoxemia that required intubation for airway protection. Laryngoscopy revealed severely swollen epiglottis and larynx. There were purulent secretions. The chest X ray shows bilateral infiltrates compatible with pneumonia. Less likely non cardiogenic pulmonary edema. Blood work showed markedly elevated WBC.  PULMONARY  Lab 02/09/12 0450 02/08/12 2000  PHART 7.365 7.316*  PCO2ART 34.5* 43.9  PO2ART 67.5* 75.0*  HCO3 19.0* 22.4  O2SAT 92.6 93.0   Ventilator Settings: Vent Mode:  [-] PRVC FiO2 (%):  [40 %-100 %] 50 % Set Rate:  [14 bmp-18 bmp] 14 bmp Vt Set:  [530 mL] 530 mL PEEP:  [5 cmH20] 5 cmH20 Plateau Pressure:  [16 cmH20-21 cmH20] 20 cmH20 CXR:  Bilateral infiltrates, likely pneumonia but non cardiogenic pulmonary edema also in the differential.  ETT:  7/5>>>  A:   1) Acute respiratory failure 2) Epiglottitis and laryngitis with severe edema compromising the airway. 3) Community acquired pneumonia.  P:   - PRVC, Vt 8cc/kg, PEEP 5, RR: 16, titrate FiO2 to keep O2 sat >94%-->allow wean if comfortable but no plan for extubation.  Will need cuff leak etc prior to extubation.  - VAP precautions - see ID - Solumedrol 60 mg IV BID - Trach aspirate cultures, blood cultures, urine cultures. - Respiratory viral panel. - Urine drug  screen.  - ENT input appreciated. - CT neck without any airspace around ETT in pharynx, no intubation for at least 24 hours, likely Monday 7/8   CARDIOVASCULAR  Lab 02/08/12 2049 02/08/12 2048  TROPONINI -- --  LATICACIDVEN 3.0* --  PROBNP -- 212.6*   ECG:  Normal sinus rhythm Lines: Peripheral lines  A:  No issues P:  - Consider echocardiogram if persistent fever, positive blood cultures or worsening lung infiltrates.   RENAL  Lab  02/09/12 0400 02/09/12 0039 02/08/12 1559  NA 133* -- 136  K 4.2 -- 3.6  CL 104 -- 99  CO2 19 -- 23  BUN 13 -- 13  CREATININE 0.97 0.98 0.89  CALCIUM 8.2* -- 9.6  MG -- -- --  PHOS -- -- --   Intake/Output      07/05 0701 - 07/06 0700 07/06 0701 - 07/07 0700   I.V. (mL/kg) 689.4 (9.7) 53.3 (0.8)   IV Piggyback 550 225   Total Intake(mL/kg) 1239.4 (17.5) 278.3 (3.9)   Urine (mL/kg/hr) 550 (0.3)    Total Output 550    Net +689.4 +278.3         Foley:  7/5>>>  A:   Normal kidney function P:   Monitor BMP  GASTROINTESTINAL No results found for this basename: AST:5,ALT:5,ALKPHOS:5,BILITOT:5,PROT:5,ALBUMIN:5 in the last 168 hours  A:   No issues P:   - GI prophylaxis with protonix IV - NPO  HEMATOLOGIC  Lab 02/09/12 0400 02/09/12 0039 02/08/12 2049 02/08/12 1559  HGB 14.0 15.4 -- 15.2  HCT 39.9 44.3 -- 43.3  PLT 211 228 -- 235  INR -- -- 1.12 --  APTT -- -- -- --   A:   No issues except for elevated WBC P:  - Follow up CBC  INFECTIOUS  Lab 02/09/12 0400 02/09/12 0039 02/08/12 1559  WBC 28.5* 32.5* 26.7*  PROCALCITON -- -- --   Cultures: BCx2 7/5>>> UC 7/5>>> Resp Culture 7/5>>>  Antibiotics: - Zosyn (02/08/12) - Vancomycin (02/08/12) - Azithromycin (02/08/12)  A:   Epiglottitis and laryngitis Community acquired pneumonia  P:   - Antibiotics as above - Will follow cultures, RVP, HIV. - CT neck to eval for abscess, discuss with ENT  ENDOCRINE  Lab 02/09/12 0041  GLUCAP 176*   A:   No issues     NEUROLOGIC  A:   No issues P:   - Continuous sedation with propofol - Fentanyl PRN for ain control - Daily awakening.   BEST PRACTICE / DISPOSITION - Level of Care:  ICU - Primary Service:  CCM - Consultants:  ENT - Code Status:  Full code - Diet:  NPO - DVT Px:  Heparin - GI Px:  Protonix - Skin Integrity:  Intact - Social / Family:  Updated at bedside.  Canary Brim, NP-C [facility] Pgr: [phone] or  [phone]  I have seen and examined the patient with nurse practitioner/resident and agree with the note above.   Yolonda Kida PCCM Pager: [phone] Cell: [phone] If no response, call [phone]

## 2012-02-09 NOTE — ED Provider Notes (Signed)
I saw and evaluated the patient, reviewed the resident's note and I agree with the findings and plan.  Videolaryngoscopy performed by ED resident and attending simultaneously while Pt in CT area (looked twice), bougie guided toward posterior pharynx but with distorted landmarks did not attempt to pass bougie or ETT in CT area since able to ventilate BVM without difficulty at time and moved Pt back to main ED due to suspected swollen larynx obstructing glottic view, anesthesia attending passed tube in ED with ED resident and ED attending present and assisting and ENT present, only one attempt to pass device through glottis was made successfully by anesthesia.  Hurman Horn, MD 02/09/12 [phone]

## 2012-02-09 NOTE — Progress Notes (Signed)
Subjective: Pt sedated and intubated. Not responsive.   Objective: Vital signs in last 24 hours: Temp:  [98.9 F (37.2 C)-100.4 F (38 C)] 99.8 F (37.7 C) (07/06 0700) Pulse Rate:  [71-138] 83  (07/06 0700) Resp:  [16-36] 22  (07/06 0700) BP: (88-188)/(59-121) 103/64 mmHg (07/06 0700) SpO2:  [85 %-100 %] 100 % (07/06 0700) FiO2 (%):  [40 %-100 %] 100 % (07/06 0630) Weight:  [70.8 kg (156 lb 1.4 oz)-79.379 kg (175 lb)] 70.8 kg (156 lb 1.4 oz) (07/06 0100)  Physical examination:  The patient is intubated and unresponsive. He appears well-nourished and well-developed.  Eye: No conjunctival injection or drainage. Lids were normal.  His pupils are equal, round, reactive to light. Extraocular motion cannot be determined. Examination of the ears shows normal auricles and external auditory canals bilaterally. Nasal examination shows normal mucosa, septum, turbinates. Facial examination shows no asymmetry. Oral cavity examination shows edematous mucosa. ET tube in place.  Palpation of the neck reveals no significant lymphadenopathy or mass. The trachea is midline. The thyroid is not significantly enlarged. Cranial nerves could not be ascertained.  Skin: Skin is warm and dry. No rash noted.  Basename 02/09/12 0400 02/09/12 0039  WBC 28.5* 32.5*  HGB 14.0 15.4  HCT 39.9 44.3  PLT 211 228    Basename 02/09/12 0400 02/09/12 0039 02/08/12 1559  NA 133* -- 136  K 4.2 -- 3.6  CL 104 -- 99  CO2 19 -- 23  GLUCOSE 249* -- 172*  BUN 13 -- 13  CREATININE 0.97 0.98 --  CALCIUM 8.2* -- 9.6    Medications:  I have reviewed the patient's current medications. Scheduled:   . antiseptic oral rinse  15 mL Mouth Rinse QID  . azithromycin  500 mg Intravenous Once  . azithromycin  500 mg Intravenous Q24H  . cefTRIAXone (ROCEPHIN) IVPB 1 gram/50 mL D5W  1 g Intravenous Once  . chlorhexidine  15 mL Mouth Rinse BID  . chlorhexidine      . clindamycin      . clindamycin (CLEOCIN) IV  600 mg  Intravenous Once  . dextrose      . etomidate      . etomidate  30 mg Intravenous Once  . heparin  5,000 Units Subcutaneous Q8H  . lidocaine (cardiac) 100 mg/2ml      . methylPREDNISolone (SOLU-MEDROL) injection  125 mg Intravenous Once  . methylPREDNISolone (SOLU-MEDROL) injection  60 mg Intravenous Q12H  . methylPREDNISolone sodium succinate      . midazolam  3 mg Intravenous Once  . pantoprazole (PROTONIX) IV  40 mg Intravenous QHS  . piperacillin-tazobactam  3.375 g Intravenous NOW  . piperacillin-tazobactam (ZOSYN)  IV  3.375 g Intravenous Q8H  . Racepinephrine HCl  0.5 mL Nebulization Once  . Racepinephrine HCl      . rocuronium      . sodium chloride  1,000 mL Intravenous Once  . sodium chloride  2,000 mL Intravenous Once  . succinylcholine      . succinylcholine  1.5 mg/kg Intravenous Once  . vancomycin  1,000 mg Intravenous NOW  . vancomycin  1,000 mg Intravenous Q8H   ZOX:WRUEAV chloride, albuterol, fentaNYL, midazolam  Assessment/Plan: Acute pharyngitis with epiglottitis and severe laryngeal edema. Now with likely pneumonia and significantly elevated WBC. Pt now intubated. Agree with IV abx and steroid. Will follow.    LOS: 1 day   Flemon Kelty,SUI W 02/09/2012, 7:52 AM

## 2012-02-10 ENCOUNTER — Inpatient Hospital Stay (HOSPITAL_COMMUNITY): Payer: Self-pay

## 2012-02-10 DIAGNOSIS — E46 Unspecified protein-calorie malnutrition: Secondary | ICD-10-CM

## 2012-02-10 LAB — CBC
MCH: 31.2 pg (ref 26.0–34.0)
MCHC: 34 g/dL (ref 30.0–36.0)
Platelets: 209 10*3/uL (ref 150–400)
RBC: 4.42 MIL/uL (ref 4.22–5.81)
RDW: 12.5 % (ref 11.5–15.5)

## 2012-02-10 LAB — BASIC METABOLIC PANEL
Calcium: 9.2 mg/dL (ref 8.4–10.5)
GFR calc non Af Amer: >90 mL/min (ref >90)
Glucose, Bld: 153 mg/dL — ABNORMAL HIGH (ref 70–99)
Sodium: 138 mEq/L (ref 135–145)

## 2012-02-10 MED ORDER — OXEPA PO LIQD
1000.0000 mL | ORAL | Status: DC
  Filled 2012-02-10 (×2): qty 1000

## 2012-02-10 MED ORDER — OXEPA PO LIQD
1000.0000 mL | ORAL | Status: DC
  Administered 2012-02-10: 1000 mL
  Filled 2012-02-10 (×3): qty 1000

## 2012-02-10 MED ORDER — NICOTINE 21 MG/24HR TD PT24
21.0000 mg | MEDICATED_PATCH | Freq: Every day | TRANSDERMAL | Status: DC
  Administered 2012-02-10 – 2012-02-14 (×5): 21 mg via TRANSDERMAL
  Filled 2012-02-10 (×5): qty 1

## 2012-02-10 NOTE — Progress Notes (Signed)
Name: Samuel Cole MRN: [id number] DOB: July 24, 1971    LOS: 2  PULMONARY / CRITICAL CARE MEDICINE  HPI:   41 year old male with no significant PMH except for marijuana abuse. Presents with 3 to 4 days history of sore throat, non productive cough, odynophagia, fever, chills and malaise. He came to the ED yesterday and was diagnosed with pharyngitis and was sent home on prednisone, naproxen and tramadol. Over the last 24 hrs his symptoms progressed and became SOB with stridor. 911 was called and was brought to the ED on supplemental O2. At admission there was no stridor and was saturating 100% on RA. He was sent for a CT of the neck and while on the CT he developed stridor, hypoxemia and became unconscious. The first intubation attempt in the CT scan was unsuccessful but they were able to ventilate him well with the bag. He was taken back to the ER and was intubated by anesthesia and ENT. Apparently there was severe epiglottis and laryngeal edema with purulent secretions. Once intubated the patient remained hemodynamically stable. At the time of my examination the patient is sedated with propofol but arousable and able to follow commands.     Vital Signs: Temp:  [98.1 F (36.7 C)-100.4 F (38 C)] 98.1 F (36.7 C) (07/07 0800) Pulse Rate:  [68-89] 73  (07/07 0800) Resp:  [13-32] 17  (07/07 0800) BP: (91-118)/(45-102) 93/51 mmHg (07/07 0700) SpO2:  [92 %-100 %] 100 % (07/07 0800) FiO2 (%):  [40 %-50 %] 40 % (07/07 0800) Weight:  [158 lb 8.2 oz (71.9 kg)] 158 lb 8.2 oz (71.9 kg) (07/07 0446)  Physical Examination: General:  Intubated, mechanically ventilated, no acute distress Neuro:  Awake, able to follow commands, synchronous, nonfocal HEENT:  PERRL, pink conjunctivae, moist membranes Neck:  Supple, no JVD   Cardiovascular:  RRR, no M/R/G Lungs:  CTA, no W/R/R Abdomen:  Soft, nontender, nondistended, bowel sounds present Musculoskeletal:  Moves all extremities, no pedal edema Skin:  No  rash    ASSESSMENT AND PLAN 41 year old male with no significant PMH. Presents with 4 days history of severe sore throat, fever, chills, odynophagia. In the ER he developed stridor and hypoxemia that required intubation for airway protection. Laryngoscopy revealed severely swollen epiglottis and larynx. There were purulent secretions. The chest X ray shows bilateral infiltrates compatible with pneumonia. Less likely non cardiogenic pulmonary edema. Blood work showed markedly elevated WBC.  PULMONARY  Lab 02/09/12 0450 02/08/12 2000  PHART 7.365 7.316*  PCO2ART 34.5* 43.9  PO2ART 67.5* 75.0*  HCO3 19.0* 22.4  O2SAT 92.6 93.0   Ventilator Settings: Vent Mode:  [-] PRVC FiO2 (%):  [40 %-50 %] 40 % Set Rate:  [16 bmp] 16 bmp Vt Set:  [53 mL-530 mL] 530 mL PEEP:  [5 cmH20] 5 cmH20 Plateau Pressure:  [16 cmH20-18 cmH20] 16 cmH20 CXR:  Bilateral infiltrates, likely pneumonia but non cardiogenic pulmonary edema also in the differential.  ETT:  7/5>>>  A:   Acute respiratory failure Epiglottitis and laryngitis with severe edema compromising the airway. Community acquired pneumonia.  P:   - PRVC--allow wean if comfortable but no plan for extubation.  Small cuff leak noted 7/7 - VAP precautions - see ID - Solumedrol 60 mg IV BID, wean post extubation - Respiratory viral panel. - ENT input appreciated. - CT neck without any airspace around ETT in pharynx, no extubation for at least 24 hours, likely Monday 7/8   CARDIOVASCULAR  Lab 02/08/12 2049  02/08/12 2048  TROPONINI -- --  LATICACIDVEN 3.0* --  PROBNP -- 212.6*   ECG:  Normal sinus rhythm Lines: Peripheral lines  A:  No issues P:  - Consider echocardiogram if persistent fever, positive blood cultures or worsening lung infiltrates.   RENAL  Lab 02/10/12 0410 02/09/12 0400 02/09/12 0039 02/08/12 1559  NA 138 133* -- 136  K 5.7* 4.2 -- --  CL 104 104 -- 99  CO2 24 19 -- 23  BUN 14 13 -- 13  CREATININE 1.00 0.97  0.98 0.89  CALCIUM 9.2 8.2* -- 9.6  MG -- -- -- --  PHOS -- -- -- --   Intake/Output      07/06 0701 - 07/07 0700 07/07 0701 - 07/08 0700   I.V. (mL/kg) 1094.6 (15.2) 35.7 (0.5)   IV Piggyback 797.5 225   Total Intake(mL/kg) 1892.1 (26.3) 260.7 (3.6)   Urine (mL/kg/hr) 3095 (1.8) 130   Total Output 3095 130   Net -1202.9 +130.7         Foley:  7/5>>>  A:   Normal kidney function P:   Monitor BMP  GASTROINTESTINAL No results found for this basename: AST:5,ALT:5,ALKPHOS:5,BILITOT:5,PROT:5,ALBUMIN:5 in the last 168 hours  A:   No issues P:   - GI prophylaxis with protonix IV - NPO - place panda, start tube feedings today  HEMATOLOGIC  Lab 02/10/12 0410 02/09/12 0400 02/09/12 0039 02/08/12 2049 02/08/12 1559  HGB 13.8 14.0 15.4 -- 15.2  HCT 40.6 39.9 44.3 -- 43.3  PLT 209 211 228 -- 235  INR -- -- -- 1.12 --  APTT -- -- -- -- --   A:   No issues except for elevated WBC P:  - Follow up CBC  INFECTIOUS  Lab 02/10/12 0410 02/09/12 0400 02/09/12 0039 02/08/12 1559  WBC 23.2* 28.5* 32.5* 26.7*  PROCALCITON -- -- -- --   Cultures: BCx2 7/5>>> UC 7/5>>> Resp Culture 7/5>>> no organisms Resp viral panel 7/7 >> 7/6 HIV >> negative  Antibiotics: - Zosyn 7/5>>> - Vancomycin 7/5>>> - Azithromycin 7/5>>>  A:   Epiglottitis and laryngitis Community acquired pneumonia  P:   - Antibiotics as above - Appreciate ENT input  ENDOCRINE  Lab 02/09/12 0041  GLUCAP 176*   A:   No issues     NEUROLOGIC  A:   No issues P:   - Continuous sedation with propofol - Fentanyl PRN for ain control - Daily awakening.   BEST PRACTICE / DISPOSITION - Level of Care:  ICU - Primary Service:  CCM - Consultants:  ENT - Code Status:  Full code - Diet:  NPO - DVT Px:  Heparin - GI Px:  Protonix - Skin Integrity:  Intact - Social / Family:  Updated at bedside.    Canary Brim, NP-C [facility] Pgr: [phone] or  [phone]  Attending:  I have seen and examined the patient with nurse practitioner/resident and agree with and have edited the note above.   Yolonda Kida PCCM Pager: [phone] Cell: [phone] If no response, call [phone]

## 2012-02-10 NOTE — Progress Notes (Signed)
Subjective: Pt still sedated and intubated. Not responsive.   Objective: Vital signs in last 24 hours: Temp:  [98.9 F (37.2 C)-100.4 F (38 C)] 99.8 F (37.7 C) (07/07 0100) Pulse Rate:  [69-94] 89  (07/07 0100) Resp:  [13-36] 27  (07/07 0100) BP: (88-139)/(45-102) 118/66 mmHg (07/07 0100) SpO2:  [85 %-100 %] 92 % (07/07 0100) FiO2 (%):  [40 %-100 %] 40 % (07/07 0100)  Physical examination:  The patient is intubated and unresponsive. He appears well-nourished and well-developed.  Eye: No conjunctival injection or drainage. Lids were normal.  His pupils are equal, round, reactive to light. Extraocular motion cannot be determined. Examination of the ears shows normal auricles and external auditory canals bilaterally. Nasal examination shows normal mucosa, septum, turbinates. Facial examination shows no asymmetry. Oral cavity examination shows edematous mucosa. ET tube in place. Palpation of the neck reveals no significant lymphadenopathy or mass. The trachea is midline. The thyroid is not significantly enlarged. Cranial nerves could not be ascertained.  Skin: Skin is warm and dry. No rash noted.   Basename 02/09/12 0400 02/09/12 0039  WBC 28.5* 32.5*  HGB 14.0 15.4  HCT 39.9 44.3  PLT 211 228    Basename 02/09/12 0400 02/09/12 0039 02/08/12 1559  NA 133* -- 136  K 4.2 -- 3.6  CL 104 -- 99  CO2 19 -- 23  GLUCOSE 249* -- 172*  BUN 13 -- 13  CREATININE 0.97 0.98 --  CALCIUM 8.2* -- 9.6    Medications:  I have reviewed the patient's current medications. Scheduled:   . antiseptic oral rinse  15 mL Mouth Rinse QID  . azithromycin  500 mg Intravenous Q24H  . chlorhexidine  15 mL Mouth Rinse BID  . clindamycin      . dextrose      . etomidate  30 mg Intravenous Once  . heparin  5,000 Units Subcutaneous Q8H  . methylPREDNISolone (SOLU-MEDROL) injection  125 mg Intravenous Once  . methylPREDNISolone (SOLU-MEDROL) injection  60 mg Intravenous Q12H  . midazolam  3 mg  Intravenous Once  . pantoprazole (PROTONIX) IV  40 mg Intravenous QHS  . piperacillin-tazobactam  3.375 g Intravenous NOW  . piperacillin-tazobactam (ZOSYN)  IV  3.375 g Intravenous Q8H  . rocuronium      . sodium chloride  500 mL Intravenous Once  . succinylcholine  1.5 mg/kg Intravenous Once  . vancomycin  1,000 mg Intravenous NOW  . vancomycin  1,000 mg Intravenous Q8H    Assessment/Plan: Acute pharyngitis with epiglottitis and severe laryngeal edema. Neck CT shows no drainable abscess. Now with likely pneumonia and significantly elevated WBC. Pt now intubated. Agree with IV abx and steroid. Will follow.    LOS: 2 days   Rickiya Picariello,SUI W 02/10/2012, 1:50 AM

## 2012-02-11 ENCOUNTER — Inpatient Hospital Stay (HOSPITAL_COMMUNITY): Payer: Self-pay

## 2012-02-11 LAB — BASIC METABOLIC PANEL
CO2: 27 mEq/L (ref 19–32)
Calcium: 8.9 mg/dL (ref 8.4–10.5)
GFR calc Af Amer: >90 mL/min (ref >90)
GFR calc non Af Amer: 78 mL/min — ABNORMAL LOW (ref >90)
Sodium: 139 mEq/L (ref 135–145)

## 2012-02-11 LAB — CULTURE, RESPIRATORY W GRAM STAIN

## 2012-02-11 LAB — URINE DRUGS OF ABUSE SCREEN W ALC, ROUTINE (REF LAB)
Amphetamine Screen, Ur: NEGATIVE
Cocaine Metabolites: NEGATIVE
Creatinine,U: 133.9 mg/dL
Ethyl Alcohol: <10 mg/dL (ref <10)
Opiate Screen, Urine: NEGATIVE
Phencyclidine (PCP): NEGATIVE

## 2012-02-11 LAB — CBC
Platelets: 232 10*3/uL (ref 150–400)
RBC: 4.09 MIL/uL — ABNORMAL LOW (ref 4.22–5.81)
WBC: 18.2 10*3/uL — ABNORMAL HIGH (ref 4.0–10.5)

## 2012-02-11 LAB — RESPIRATORY VIRUS PANEL
CoronavirusHKU1: NOT DETECTED
CoronavirusNL63: NOT DETECTED
CoronavirusOC43: NOT DETECTED
Influenza A: NOT DETECTED
Influenza B: NOT DETECTED
Parainfluenza 2: NOT DETECTED
Parainfluenza 3: NOT DETECTED
Rhinovirus: NOT DETECTED

## 2012-02-11 LAB — VANCOMYCIN, TROUGH: Vancomycin Tr: 46.3 ug/mL (ref 10.0–20.0)

## 2012-02-11 LAB — TRIGLYCERIDES: Triglycerides: 268 mg/dL — ABNORMAL HIGH (ref <150)

## 2012-02-11 MED ORDER — PRO-STAT SUGAR FREE PO LIQD
30.0000 mL | Freq: Two times a day (BID) | ORAL | Status: DC
  Administered 2012-02-11 (×2): 30 mL
  Filled 2012-02-11 (×4): qty 30

## 2012-02-11 MED ORDER — PROPOFOL 10 MG/ML IV EMUL
INTRAVENOUS | Status: AC
  Filled 2012-02-11: qty 100

## 2012-02-11 MED ORDER — OXEPA PO LIQD
1000.0000 mL | ORAL | Status: DC
  Administered 2012-02-11: 1000 mL
  Filled 2012-02-11 (×4): qty 1000

## 2012-02-11 MED ORDER — PROPOFOL 10 MG/ML IV EMUL
INTRAVENOUS | Status: AC
  Filled 2012-02-11: qty 200

## 2012-02-11 NOTE — Progress Notes (Signed)
Subjective: Patient remains intubated and sedated. Not responsive.  Objective: Vital signs in last 24 hours: Temp:  [98.1 F (36.7 C)-100.3 F (37.9 C)] 99.5 F (37.5 C) (07/08 0000) Pulse Rate:  [62-93] 85  (07/08 0000) Resp:  [16-27] 19  (07/08 0000) BP: (93-126)/(49-74) 121/60 mmHg (07/08 0000) SpO2:  [92 %-100 %] 100 % (07/08 0000) FiO2 (%):  [40 %] 40 % (07/08 0000) Weight:  [71.9 kg (158 lb 8.2 oz)] 71.9 kg (158 lb 8.2 oz) (07/07 0446)  Physical examination:  The patient is intubated and unresponsive. He appears well-nourished and well-developed.  Eye: No conjunctival injection or drainage. Lids were normal.  His pupils are equal, round, reactive to light. Extraocular motion cannot be determined. Examination of the ears shows normal auricles and external auditory canals bilaterally. Nasal examination shows normal mucosa, septum, turbinates. Facial examination shows no asymmetry. Oral cavity examination shows edematous mucosa. ET tube in place. Palpation of the neck reveals no significant lymphadenopathy or mass. The trachea is midline. The thyroid is not significantly enlarged. Cranial nerves could not be ascertained.  Skin: Skin is warm and dry. No rash noted.  Basename 02/10/12 0410 02/09/12 0400  WBC 23.2* 28.5*  HGB 13.8 14.0  HCT 40.6 39.9  PLT 209 211    Basename 02/10/12 0410 02/09/12 0400  NA 138 133*  K 5.7* 4.2  CL 104 104  CO2 24 19  GLUCOSE 153* 249*  BUN 14 13  CREATININE 1.00 0.97  CALCIUM 9.2 8.2*    Medications:  I have reviewed the patient's current medications. Scheduled:   . antiseptic oral rinse  15 mL Mouth Rinse QID  . azithromycin  500 mg Intravenous Q24H  . chlorhexidine  15 mL Mouth Rinse BID  . etomidate  30 mg Intravenous Once  . feeding supplement (OXEPA)  1,000 mL Per Tube Q24H  . heparin  5,000 Units Subcutaneous Q8H  . methylPREDNISolone (SOLU-MEDROL) injection  125 mg Intravenous Once  . methylPREDNISolone (SOLU-MEDROL)  injection  60 mg Intravenous Q12H  . midazolam  3 mg Intravenous Once  . nicotine  21 mg Transdermal Daily  . pantoprazole (PROTONIX) IV  40 mg Intravenous QHS  . piperacillin-tazobactam (ZOSYN)  IV  3.375 g Intravenous Q8H  . succinylcholine  1.5 mg/kg Intravenous Once  . vancomycin  1,000 mg Intravenous Q8H    Assessment/Plan: Acute pharyngitis with epiglottitis and severe laryngeal edema. Neck CT shows no drainable abscess. Now with likely pneumonia. WBC improving. May consider extubation is significant air leak is noted.  Agree with IV abx and steroid. Will follow.    LOS: 3 days   Ludene Stokke,SUI W 02/11/2012, 12:46 AM

## 2012-02-11 NOTE — Progress Notes (Signed)
ANTIBIOTIC CONSULT NOTE - FOLLOW UP  Pharmacy Consult for Vancomycin Indication: severe epiglottitis/laryngitis and possible PNA  No Known Allergies  Patient Measurements: Height: 5\' 10"  (177.8 cm) Weight: 156 lb 1.4 oz (70.8 kg) IBW/kg (Calculated) : 73  Adjusted Body Weight: 70.8 kg  Vital Signs: Temp: 99.5 F (37.5 C) (07/08 1600) Temp src: Core (Comment) (07/08 0800) BP: 102/56 mmHg (07/08 1616) Pulse Rate: 57  (07/08 1616) Intake/Output from previous day: 07/07 0701 - 07/08 0700 In: 2239.2 [I.V.:869.2; NG/GT:560; IV Piggyback:810] Out: 1235 [Urine:1235] Intake/Output from this shift: Total I/O In: 1176.7 [I.V.:214.2; NG/GT:500; IV Piggyback:462.5] Out: 975 [Urine:975]  Labs:  [facility] 02/11/12 0429 02/10/12 0410 02/09/12 0400  WBC 18.2* 23.2* 28.5*  HGB 13.2 13.8 14.0  PLT 232 209 211  LABCREA -- -- --  CREATININE 1.16 1.00 0.97   Estimated Creatinine Clearance: 85.6 ml/min (by C-G formula based on Cr of 1.16).  Basename 02/11/12 1523  VANCOTROUGH 46.3*  VANCOPEAK --  Drue Dun --  GENTTROUGH --  GENTPEAK --  GENTRANDOM --  TOBRATROUGH --  TOBRAPEAK --  TOBRARND --  AMIKACINPEAK --  AMIKACINTROU --  AMIKACIN --     Microbiology: Recent Results (from the past 720 hour(s))  CULTURE, BLOOD (ROUTINE X 2)     Status: Normal (Preliminary result)   Collection Time   02/08/12  7:25 PM      Component Value Range Status Comment   Specimen Description BLOOD ARM LEFT   Final    Special Requests     Final    Value: BOTTLES DRAWN AEROBIC AND ANAEROBIC AERO 9CC,ANAE 3.5CC   Culture  Setup Time 02/09/2012 01:58   Final    Culture     Final    Value:        BLOOD CULTURE RECEIVED NO GROWTH TO DATE CULTURE WILL BE HELD FOR 5 DAYS BEFORE ISSUING A FINAL NEGATIVE REPORT   Report Status PENDING   Incomplete   CULTURE, BLOOD (ROUTINE X 2)     Status: Normal (Preliminary result)   Collection Time   02/08/12  7:35 PM      Component Value Range Status Comment   Specimen Description BLOOD HAND LEFT   Final    Special Requests     Final    Value: BOTTLES DRAWN AEROBIC AND ANAEROBIC BLUE 10CC RED 5CC   Culture  Setup Time 02/09/2012 01:58   Final    Culture     Final    Value:        BLOOD CULTURE RECEIVED NO GROWTH TO DATE CULTURE WILL BE HELD FOR 5 DAYS BEFORE ISSUING A FINAL NEGATIVE REPORT   Report Status PENDING   Incomplete   MRSA PCR SCREENING     Status: Normal   Collection Time   02/09/12 12:32 AM      Component Value Range Status Comment   MRSA by PCR NEGATIVE  NEGATIVE Final   CULTURE, RESPIRATORY     Status: Normal   Collection Time   02/09/12  5:29 AM      Component Value Range Status Comment   Specimen Description TRACHEAL ASPIRATE   Final    Special Requests NONE   Final    Gram Stain     Final    Value: FEW WBC PRESENT, PREDOMINANTLY PMN     FEW SQUAMOUS EPITHELIAL CELLS PRESENT     NO ORGANISMS SEEN   Culture NO GROWTH 2 DAYS   Final    Report Status 02/11/2012 FINAL  Final     Anti-infectives     Start     Dose/Rate Route Frequency Ordered Stop   02/09/12 0800   vancomycin (VANCOCIN) IVPB 1000 mg/200 mL premix  Status:  Discontinued        1,000 mg 200 mL/hr over 60 Minutes Intravenous Every 8 hours 02/09/12 0026 02/11/12 1635   02/09/12 0800  piperacillin-tazobactam (ZOSYN) IVPB 3.375 g       3.375 g 12.5 mL/hr over 240 Minutes Intravenous Every 8 hours 02/09/12 0026     02/09/12 0030   vancomycin (VANCOCIN) IVPB 1000 mg/200 mL premix        1,000 mg 200 mL/hr over 60 Minutes Intravenous NOW 02/09/12 0026 02/09/12 0257   02/09/12 0030  piperacillin-tazobactam (ZOSYN) IVPB 3.375 g       3.375 g 100 mL/hr over 30 Minutes Intravenous NOW 02/09/12 0026 02/09/12 0152   02/09/12 0000   azithromycin (ZITHROMAX) 500 mg in dextrose 5 % 250 mL IVPB        500 mg 250 mL/hr over 60 Minutes Intravenous Every 24 hours 02/08/12 2351     02/08/12 1945   azithromycin (ZITHROMAX) 500 mg in dextrose 5 % 250 mL IVPB        500  mg 250 mL/hr over 60 Minutes Intravenous  Once 02/08/12 1944 02/08/12 2341   02/08/12 1945   cefTRIAXone (ROCEPHIN) 1 g in dextrose 5 % 50 mL IVPB        1 g 100 mL/hr over 30 Minutes Intravenous  Once 02/08/12 1944 02/08/12 2331   02/08/12 1900   clindamycin (CLEOCIN) IVPB 600 mg        600 mg 100 mL/hr over 30 Minutes Intravenous  Once 02/08/12 1850 02/08/12 2055   02/08/12 1827   clindamycin (CLEOCIN) 600 MG/4ML injection     Comments: MUMFORD, Hisao Doo: cabinet override         02/08/12 1827 02/09/12 0629          Assessment: 41 yo male on vancomycin 1g IV q 8 hrs for epiglottitis, laryngitis, and possible PNA.  Cultures negative so far. Vancomycin trough supratherapeutic on current dose.  Confirmed with RN that dose had NOT been hung prior to level being drawn.  Scr trending up from baseline.  Goal of Therapy:  Vancomycin trough level 15-20 mcg/ml  Plan:  1. Hold vancomycin dose for now. 2. Recheck vancomycin level with AM labs.  Gardner Candle 02/11/2012,4:38 PM

## 2012-02-11 NOTE — Progress Notes (Signed)
Nutrition Follow-up / Consult  Intervention:    Decrease Oxepa to 45 ml/h, add Prostat 30 ml BID to provide 1820 kcals, 98 grams protein, 848 ml free water daily.  TF plus Propofol will provide a total of 2134 kcals per day.  Assessment:   Patient remains intubated.  Now with PNA.  Diet Order:  Oxepa at 50 ml/h providing 1800 calories, 75g protein, and free water daily.  Propofol at 11.9 ml/h is providing 314 kcals/day.  Total intake with TF and Propofol is 2114 kcals, 75 grams protein daily.  Meds: Scheduled Meds:   . antiseptic oral rinse  15 mL Mouth Rinse QID  . azithromycin  500 mg Intravenous Q24H  . chlorhexidine  15 mL Mouth Rinse BID  . etomidate  30 mg Intravenous Once  . feeding supplement (OXEPA)  1,000 mL Per Tube Q24H  . heparin  5,000 Units Subcutaneous Q8H  . methylPREDNISolone (SOLU-MEDROL) injection  125 mg Intravenous Once  . methylPREDNISolone (SOLU-MEDROL) injection  60 mg Intravenous Q12H  . midazolam  3 mg Intravenous Once  . nicotine  21 mg Transdermal Daily  . pantoprazole (PROTONIX) IV  40 mg Intravenous QHS  . piperacillin-tazobactam (ZOSYN)  IV  3.375 g Intravenous Q8H  . succinylcholine  1.5 mg/kg Intravenous Once  . vancomycin  1,000 mg Intravenous Q8H   Continuous Infusions:   . propofol 24.979 mcg/kg/min (02/11/12 0800)  . DISCONTD: feeding supplement (OXEPA)     PRN Meds:.sodium chloride, albuterol, fentaNYL, LORazepam, midazolam  Labs:  CMP     Component Value Date/Time   NA 139 02/11/2012 0429   K 4.0 02/11/2012 0429   CL 105 02/11/2012 0429   CO2 27 02/11/2012 0429   GLUCOSE 171* 02/11/2012 0429   BUN 17 02/11/2012 0429   CREATININE 1.16 02/11/2012 0429   CALCIUM 8.9 02/11/2012 0429   GFRNONAA 78* 02/11/2012 0429   GFRAA >90 02/11/2012 0429   CBG (last 3)   Basename 02/09/12 0041  GLUCAP 176*     Intake/Output Summary (Last 24 hours) at 02/11/12 1006 Last data filed at 02/11/12 0800  Gross per 24 hour  Intake 2124.05 ml  Output    1705 ml  Net 419.05 ml    Weight Status:  70.8 kg (stable); BMI=22.4  Re-estimated needs:  2085 kcals, 85-100 grams protein, 2.1-2.2 liters fluid daily.  Nutrition Dx:  Inadequate oral intake, ongoing.  Goal:  Enteral nutrition to meet >90% of estimated nutrition needs, unmet.  Monitor:  TF tolerance, labs, weight trend.   Joaquin Courts, RD, CNSC, LDN Pager# [phone] After Hours Pager# [phone]

## 2012-02-11 NOTE — Progress Notes (Signed)
CRITICAL VALUE ALERT  Critical value received: Vanc trough 46.3 Date of notification: 02/11/12 Time of notification: 1613  Critical value read back:yes  Nurse who received alert: Lina Sar  MD notified (1st page):Samuella Cota D  Time of first page: 1613  MD notified (2nd page):Samuella Cota D  Time of second page:  Responding WU:[id number] Pharm D  Time MD responded: [phone]

## 2012-02-11 NOTE — Progress Notes (Signed)
UR Completed.  Samuel Cole Jane [phone] 02/11/2012  

## 2012-02-11 NOTE — Progress Notes (Signed)
Name: Samuel Cole MRN: [id number] DOB: 13-Feb-1971    LOS: 3  PULMONARY / CRITICAL CARE MEDICINE  HPI:   41 year old male with no significant PMH except for marijuana abuse. Presents with 3 to 4 days history of sore throat, non productive cough, odynophagia, fever, chills and malaise. He came to the ED yesterday and was diagnosed with pharyngitis and was sent home on prednisone, naproxen and tramadol. Over the last 24 hrs his symptoms progressed and became SOB with stridor. 911 was called and was brought to the ED on supplemental O2. At admission there was no stridor and was saturating 100% on RA. He was sent for a CT of the neck and while on the CT he developed stridor, hypoxemia and became unconscious. The first intubation attempt in the CT scan was unsuccessful but they were able to ventilate him well with the bag. He was taken back to the ER and was intubated by anesthesia and ENT. Apparently there was severe epiglottis and laryngeal edema with purulent secretions. Once intubated the patient remained hemodynamically stable.   Brief Patient Summary: 41 yo man with little PMH, admitted with pharyngitis that progressed to severe epiglottitis requiring intubation for airway protection.  CT scan neck without abscess.    Vital Signs: Temp:  [98.6 F (37 C)-100.3 F (37.9 C)] 100 F (37.8 C) (07/08 0800) Pulse Rate:  [62-93] 91  (07/08 0800) Resp:  [16-32] 32  (07/08 0800) BP: (98-139)/(6-74) 139/71 mmHg (07/08 0800) SpO2:  [98 %-100 %] 98 % (07/08 0800) FiO2 (%):  [30 %-40 %] 30 % (07/08 0800) Weight:  [70.8 kg (156 lb 1.4 oz)] 70.8 kg (156 lb 1.4 oz) (07/08 0400)  Intake/Output Summary (Last 24 hours) at 02/11/12 3664 Last data filed at 02/11/12 0800  Gross per 24 hour  Intake 2158.45 ml  Output   1705 ml  Net 453.45 ml     Physical Examination: General:  Intubated, mechanically ventilated, no acute distress Neuro:  Awake, able to follow commands, synchronous, nonfocal HEENT:   PERRL, pink conjunctivae, moist membranes Neck:  Supple, no JVD   Cardiovascular:  RRR, no M/R/G Lungs:  CTA, no W/R/R Abdomen:  Soft, nontender, nondistended, bowel sounds present Musculoskeletal:  Moves all extremities, no pedal edema Skin:  No rash    ASSESSMENT AND PLAN 41 year old male with no significant PMH. Presents with 4 days history of severe sore throat, fever, chills, odynophagia. In the ER he developed stridor and hypoxemia that required intubation for airway protection. Laryngoscopy revealed severely swollen epiglottis and larynx. There were purulent secretions. The chest X ray shows bilateral infiltrates compatible with pneumonia. Less likely non cardiogenic pulmonary edema. Blood work showed markedly elevated WBC.  PULMONARY  Lab 02/09/12 0450 02/08/12 2000  PHART 7.365 7.316*  PCO2ART 34.5* 43.9  PO2ART 67.5* 75.0*  HCO3 19.0* 22.4  O2SAT 92.6 93.0   Ventilator Settings: Vent Mode:  [-] PSV FiO2 (%):  [30 %-40 %] 30 % Set Rate:  [15 bmp-16 bmp] 16 bmp Vt Set:  [530 mL] 530 mL PEEP:  [5 cmH20] 5 cmH20 Pressure Support:  [10 cmH20] 10 cmH20 Plateau Pressure:  [14 cmH20-17 cmH20] 17 cmH20 CXR: 7/8 Bibasilar atx vs infiltrates ETT:  7/5>>>  A:   Acute respiratory failure Epiglottitis and laryngitis with severe edema compromising the airway. Possible Community acquired pneumonia.  P:   - PRVC--allow wean if comfortable but no plan for extubation.  Small cuff leak noted 7/7, seems to be somewhat better  7/8 but still not the normal leak expected.  - VAP precautions - see ID - Solumedrol 60 mg IV BID, wean post extubation - Respiratory viral panel pending - ENT input appreciated. - CT neck without any airspace around ETT in pharynx, no extubation for another 24 hours based on ENT's recs that we have better cuff leak. Will discuss with them whether they need to be present or whether we should do in OR with ENT assist   CARDIOVASCULAR  Lab 02/08/12 2049  02/08/12 2048  TROPONINI -- --  LATICACIDVEN 3.0* --  PROBNP -- 212.6*   ECG:  Normal sinus rhythm Lines: Peripheral lines  A:  No issues P:    RENAL  Lab 02/11/12 0429 02/10/12 0410 02/09/12 0400 02/09/12 0039 02/08/12 1559  NA 139 138 133* -- 136  K 4.0 5.7* -- -- --  CL 105 104 104 -- 99  CO2 27 24 19  -- 23  BUN 17 14 13  -- 13  CREATININE 1.16 1.00 0.97 0.98 0.89  CALCIUM 8.9 9.2 8.2* -- 9.6  MG -- -- -- -- --  PHOS -- -- -- -- --   Intake/Output      07/07 0701 - 07/08 0700 07/08 0701 - 07/09 0700   I.V. (mL/kg) 869.2 (12.3) 21.9 (0.3)   NG/GT 510 30   IV Piggyback 810 212.5   Total Intake(mL/kg) 2189.2 (30.9) 264.4 (3.7)   Urine (mL/kg/hr) 1235 (0.7) 600   Total Output 1235 600   Net +954.2 -335.6         Foley:  7/5>>>  A:   Normal kidney function P:   Monitor BMP  GASTROINTESTINAL No results found for this basename: AST:5,ALT:5,ALKPHOS:5,BILITOT:5,PROT:5,ALBUMIN:5 in the last 168 hours  A:   No issues P:   - GI prophylaxis with protonix IV - NPO - panda, tube feedings  HEMATOLOGIC  Lab 02/11/12 0429 02/10/12 0410 02/09/12 0400 02/09/12 0039 02/08/12 2049 02/08/12 1559  HGB 13.2 13.8 14.0 15.4 -- 15.2  HCT 36.9* 40.6 39.9 44.3 -- 43.3  PLT 232 209 211 228 -- 235  INR -- -- -- -- 1.12 --  APTT -- -- -- -- -- --   A:   No issues except for elevated WBC P:  - Follow CBC  INFECTIOUS  Lab 02/11/12 0429 02/10/12 0410 02/09/12 0400 02/09/12 0039 02/08/12 1559  WBC 18.2* 23.2* 28.5* 32.5* 26.7*  PROCALCITON -- -- -- -- --   Cultures: BCx2 7/5>>> UC 7/5>>> Resp Culture 7/5>>> no organisms Resp viral panel 7/7 >> 7/6 HIV >> negative  Antibiotics: - Zosyn 7/5>>> - Vancomycin 7/5>>> - Azithromycin 7/5>>>  A:   Epiglottitis and laryngitis Possible Community acquired pneumonia  P:   - Antibiotics as above - Appreciate ENT input  ENDOCRINE  Lab 02/09/12 0041  GLUCAP 176*   A:   No issues    NEUROLOGIC  A:   No issues P:    - Continuous sedation with propofol - Fentanyl PRN for pain control - Daily awakening.   BEST PRACTICE / DISPOSITION - Level of Care:  ICU - Primary Service:  CCM - Consultants:  ENT - Code Status:  Full code - Diet:  TF's - DVT Px:  Heparin - GI Px:  Protonix - Skin Integrity:  Intact - Social / Family:  Updated at bedside.   Levy Pupa, MD, PhD 02/11/2012, 9:15 AM [facility] [phone] or if no answer [phone]

## 2012-02-11 NOTE — Progress Notes (Signed)
rn sux pt before i started vent check with moderate tan secretions

## 2012-02-12 ENCOUNTER — Inpatient Hospital Stay (HOSPITAL_COMMUNITY): Payer: Self-pay

## 2012-02-12 LAB — BASIC METABOLIC PANEL
BUN: 22 mg/dL (ref 6–23)
CO2: 28 mEq/L (ref 19–32)
Calcium: 8.9 mg/dL (ref 8.4–10.5)
Creatinine, Ser: 1.07 mg/dL (ref 0.50–1.35)
GFR calc non Af Amer: 86 mL/min — ABNORMAL LOW (ref >90)
Glucose, Bld: 191 mg/dL — ABNORMAL HIGH (ref 70–99)
Sodium: 142 mEq/L (ref 135–145)

## 2012-02-12 LAB — CBC
MCH: 31.2 pg (ref 26.0–34.0)
MCHC: 34.1 g/dL (ref 30.0–36.0)
MCV: 91.4 fL (ref 78.0–100.0)
Platelets: 244 10*3/uL (ref 150–400)
RDW: 12.3 % (ref 11.5–15.5)

## 2012-02-12 LAB — VANCOMYCIN, TROUGH: Vancomycin Tr: 8.5 ug/mL — ABNORMAL LOW (ref 10.0–20.0)

## 2012-02-12 MED ORDER — VANCOMYCIN HCL IN DEXTROSE 1-5 GM/200ML-% IV SOLN
1000.0000 mg | Freq: Two times a day (BID) | INTRAVENOUS | Status: DC
  Administered 2012-02-12 (×2): 1000 mg via INTRAVENOUS
  Filled 2012-02-12 (×4): qty 200

## 2012-02-12 NOTE — Progress Notes (Signed)
ANTIBIOTIC CONSULT NOTE - FOLLOW UP  Pharmacy Consult for Vancomycin/Zosyn/Azithromycin Indication: severe epiglottitis/laryngitis and possible PNA  No Known Allergies  Patient Measurements: Height: 5\' 10"  (177.8 cm) Weight: 154 lb 1.6 oz (69.9 kg) IBW/kg (Calculated) : 73  Adjusted Body Weight: 70.8 kg  Vital Signs: Temp: 98.8 F (37.1 C) (07/09 0800) Temp src: Core (Comment) (07/09 0800) BP: 100/52 mmHg (07/09 0747) Pulse Rate: 62  (07/09 0800) Intake/Output from previous day: 07/08 0701 - 07/09 0700 In: 2281.4 [I.V.:688.9; NG/GT:770; IV Piggyback:822.5] Out: 1720 [Urine:1720] Intake/Output from this shift: Total I/O In: 100 [NG/GT:75; IV Piggyback:25] Out: 150 [Urine:150]  Labs:  [facility] 02/12/12 0537 02/11/12 0429 02/10/12 0410  WBC 14.6* 18.2* 23.2*  HGB 12.0* 13.2 13.8  PLT 244 232 209  LABCREA -- -- --  CREATININE 1.07 1.16 1.00   Estimated Creatinine Clearance: 91.6 ml/min (by C-G formula based on Cr of 1.07).  Basename 02/12/12 0537 02/11/12 1523  VANCOTROUGH 8.5* 46.3*  VANCOPEAK -- --  Drue Dun -- --  GENTTROUGH -- --  GENTPEAK -- --  GENTRANDOM -- --  TOBRATROUGH -- --  TOBRAPEAK -- --  TOBRARND -- --  AMIKACINPEAK -- --  AMIKACINTROU -- --  AMIKACIN -- --     Microbiology: Recent Results (from the past 720 hour(s))  CULTURE, BLOOD (ROUTINE X 2)     Status: Normal (Preliminary result)   Collection Time   02/08/12  7:25 PM      Component Value Range Status Comment   Specimen Description BLOOD ARM LEFT   Final    Special Requests     Final    Value: BOTTLES DRAWN AEROBIC AND ANAEROBIC AERO 9CC,ANAE 3.5CC   Culture  Setup Time 02/09/2012 01:58   Final    Culture     Final    Value:        BLOOD CULTURE RECEIVED NO GROWTH TO DATE CULTURE WILL BE HELD FOR 5 DAYS BEFORE ISSUING A FINAL NEGATIVE REPORT   Report Status PENDING   Incomplete   CULTURE, BLOOD (ROUTINE X 2)     Status: Normal (Preliminary result)   Collection Time   02/08/12  7:35  PM      Component Value Range Status Comment   Specimen Description BLOOD HAND LEFT   Final    Special Requests     Final    Value: BOTTLES DRAWN AEROBIC AND ANAEROBIC BLUE 10CC RED 5CC   Culture  Setup Time 02/09/2012 01:58   Final    Culture     Final    Value:        BLOOD CULTURE RECEIVED NO GROWTH TO DATE CULTURE WILL BE HELD FOR 5 DAYS BEFORE ISSUING A FINAL NEGATIVE REPORT   Report Status PENDING   Incomplete   MRSA PCR SCREENING     Status: Normal   Collection Time   02/09/12 12:32 AM      Component Value Range Status Comment   MRSA by PCR NEGATIVE  NEGATIVE Final   RESPIRATORY VIRUS PANEL (18 COMPONENTS)     Status: Normal   Collection Time   02/09/12  4:12 AM      Component Value Range Status Comment   Source - [facility]AN NASAL SWAB   Corrected CORRECTED ON 07/06 AT 0416: PREVIOUSLY REPORTED AS NASOPHARYNGEAL   Respiratory Syncytial Virus A NOT DETECTED   Final    Respiratory Syncytial Virus B NOT DETECTED   Final    Influenza A NOT DETECTED   Final  Influenza B NOT DETECTED   Final    Parainfluenza 1 NOT DETECTED   Final    Parainfluenza 2 NOT DETECTED   Final    Parainfluenza 3 NOT DETECTED   Final    Parainfluenza 4 NOT DETECTED   Final    Metapneumovirus NOT DETECTED   Final    [place]ox[place]ie and Echovirus NOT DETECTED   Final    Rhinovirus NOT DETECTED   Final    Adenovirus B NOT DETECTED   Final    Adenovirus E NOT DETECTED   Final    CoronavirusNL63 NOT DETECTED   Final    CoronavirusHKU1 NOT DETECTED   Final    Coronavirus229E NOT DETECTED   Final    CoronavirusOC43 NOT DETECTED   Final   CULTURE, RESPIRATORY     Status: Normal   Collection Time   02/09/12  5:29 AM      Component Value Range Status Comment   Specimen Description TRACHEAL ASPIRATE   Final    Special Requests NONE   Final    Gram Stain     Final    Value: FEW WBC PRESENT, PREDOMINANTLY PMN     FEW SQUAMOUS EPITHELIAL CELLS PRESENT     NO ORGANISMS SEEN   Culture NO GROWTH 2 DAYS   Final    Report  Status 02/11/2012 FINAL   Final     Anti-infectives     Start     Dose/Rate Route Frequency Ordered Stop   02/09/12 0800   vancomycin (VANCOCIN) IVPB 1000 mg/200 mL premix  Status:  Discontinued        1,000 mg 200 mL/hr over 60 Minutes Intravenous Every 8 hours 02/09/12 0026 02/11/12 1635   02/09/12 0800  piperacillin-tazobactam (ZOSYN) IVPB 3.375 g       3.375 g 12.5 mL/hr over 240 Minutes Intravenous Every 8 hours 02/09/12 0026     02/09/12 0030   vancomycin (VANCOCIN) IVPB 1000 mg/200 mL premix        1,000 mg 200 mL/hr over 60 Minutes Intravenous NOW 02/09/12 0026 02/09/12 0257   02/09/12 0030  piperacillin-tazobactam (ZOSYN) IVPB 3.375 g       3.375 g 100 mL/hr over 30 Minutes Intravenous NOW 02/09/12 0026 02/09/12 0152   02/09/12 0000   azithromycin (ZITHROMAX) 500 mg in dextrose 5 % 250 mL IVPB        500 mg 250 mL/hr over 60 Minutes Intravenous Every 24 hours 02/08/12 2351     02/08/12 1945   azithromycin (ZITHROMAX) 500 mg in dextrose 5 % 250 mL IVPB        500 mg 250 mL/hr over 60 Minutes Intravenous  Once 02/08/12 1944 02/08/12 2341   02/08/12 1945   cefTRIAXone (ROCEPHIN) 1 g in dextrose 5 % 50 mL IVPB        1 g 100 mL/hr over 30 Minutes Intravenous  Once 02/08/12 1944 02/08/12 2331   02/08/12 1900   clindamycin (CLEOCIN) IVPB 600 mg        600 mg 100 mL/hr over 30 Minutes Intravenous  Once 02/08/12 1850 02/08/12 2055   02/08/12 1827   clindamycin (CLEOCIN) 600 MG/4ML injection     Comments: MUMFORD, Ambert Virrueta: cabinet override         02/08/12 1827 02/09/12 0629          Assessment: 41 yo male on vancomycin 1g IV q 8 hrs for epiglottitis, laryngitis, and possible PNA.  Cultures negative so far. Viral panel negative  to date.  Vancomycin trough supratherapeutic at 46.3 last night on 1g IV q8h, however this am level is 8.5- unlikely to correct that fast. SCr has been stable. UOP good at 1 cc/kg/hr. Pharmacist overnight confirmed with RN that dose had NOT been  hung prior to level being drawn however documentation shows that drug was charted at 15:13 and level was done at 15:23. Feel that last PM level may have been an error or after dose given as currently good clearance depicted by UOP and SCr and low level this am. WBC trending down. Tmax 100.4   Vancomycin trough 8.5 ~14 hours after last dose given. May need slightly decreased dose.   Goal of Therapy:  Vancomycin trough level 15-20 mcg/ml  Plan:  1. Change Vancomycin to 1g IV q12h.  2. Follow-up true trough prior at [facility] on 7/10.   3. Continue Zosyn at 3.375g IV q8h- 4hr infusion. 4. Follow-up cultures and ability to narrow antibiotics.   Fayne Norrie 02/12/2012,9:05 AM

## 2012-02-12 NOTE — Progress Notes (Signed)
Subjective: Pt extubated. Now resting comfortably in bed. Alert and responsive.   Objective: Vital signs in last 24 hours: Temp:  [97.5 F (36.4 C)-100.4 F (38 C)] 97.5 F (36.4 C) (07/09 1136) Pulse Rate:  [51-99] 62  (07/09 1100) Resp:  [12-29] 16  (07/09 1100) BP: (100-160)/(6-85) 118/71 mmHg (07/09 1100) SpO2:  [96 %-100 %] 99 % (07/09 1100) FiO2 (%):  [30 %-40 %] 30 % (07/09 0800) Weight:  [69.9 kg (154 lb 1.6 oz)] 69.9 kg (154 lb 1.6 oz) (07/09 0500)  Physical examination:  The patient is alert and oriented x3. He appears well-nourished and well-developed.  No distress Eye: No conjunctival injection or drainage. Lids were normal.  His pupils are equal, round, reactive to light. Extraocular motion cannot be determined. Examination of the ears shows normal auricles and external auditory canals bilaterally. Nasal examination shows normal mucosa, septum, turbinates. Facial examination shows no asymmetry. Oral cavity examination shows normal mucosa.  Palpation of the neck reveals no significant lymphadenopathy or mass. The trachea is midline. The thyroid is not significantly enlarged. Cranial nerves could not be ascertained.  Skin: Skin is warm and dry. No rash noted.   Basename 02/12/12 0537 02/11/12 0429  WBC 14.6* 18.2*  HGB 12.0* 13.2  HCT 35.2* 36.9*  PLT 244 232    Basename 02/12/12 0537 02/11/12 0429  NA 142 139  K 3.7 4.0  CL 105 105  CO2 28 27  GLUCOSE 191* 171*  BUN 22 17  CREATININE 1.07 1.16  CALCIUM 8.9 8.9    Medications:  I have reviewed the patient's current medications. Scheduled:   . antiseptic oral rinse  15 mL Mouth Rinse QID  . azithromycin  500 mg Intravenous Q24H  . chlorhexidine  15 mL Mouth Rinse BID  . etomidate  30 mg Intravenous Once  . feeding supplement (OXEPA)  1,000 mL Per Tube Q24H  . feeding supplement  30 mL Per Tube BID  . heparin  5,000 Units Subcutaneous Q8H  . methylPREDNISolone (SOLU-MEDROL) injection  125 mg Intravenous  Once  . methylPREDNISolone (SOLU-MEDROL) injection  60 mg Intravenous Q12H  . midazolam  3 mg Intravenous Once  . nicotine  21 mg Transdermal Daily  . pantoprazole (PROTONIX) IV  40 mg Intravenous QHS  . piperacillin-tazobactam (ZOSYN)  IV  3.375 g Intravenous Q8H  . propofol      . propofol      . succinylcholine  1.5 mg/kg Intravenous Once  . vancomycin  1,000 mg Intravenous Q12H  . DISCONTD: vancomycin  1,000 mg Intravenous Q8H   Procedure:  Flexible Fiberoptic Laryngoscopy Anesthesia: Topical oxymetazoline and lidocaine Indication: Epiglottis and severe laryngeal edema. Now s/p extubation. Description: Risks, benefits, and alternatives of flexible endoscopy were explained to the patient. Specific mention was made of the risk of throat numbness with difficulty swallowing, possible bleeding from the nose and mouth, and pain from the procedure.  The patient gave oral consent to proceed.  The nasal cavities were decongested and anesthetised with a combination of oxymetazoline and 4% lidocaine solution.  The flexible scope was inserted into the right nasal cavity and advanced towards the nasopharynx.  Visualized mucosa over the turbinates and septum were normal.  The nasopharynx was clear.  Oropharyngeal walls were symmetric and mobile without lesion, mass, or edema.  Hypopharynx was also without  lesion or edema.  The epiglottis and larynx is mildly edematous True vocal folds were pale yellow, symmetric and mobile. The glottis is widely patent.  Assessment/Plan: The patient's epiglottis and laryngeal edema has significantly improved. His airway is noted to be patent on laryngoscopy. WBC has decreased. Consider switching to oral abx. Will follow.   LOS: 4 days   Tzippy Testerman,SUI W 02/12/2012, 12:32 PM

## 2012-02-12 NOTE — Progress Notes (Signed)
Incentive spirometer given to pt and instructed in it's use.  Teach back 3 used and pt returned demonstration correctly.

## 2012-02-12 NOTE — Procedures (Signed)
Extubation Procedure Note  Patient Details:   Name: Samuel Cole DOB: 20-Jan-1971 MRN: [id number]   Airway Documentation:        Pt extubated to 4L [place]. No stridor noted. BBS dim and equal. Pt tolerated well.  Evaluation  O2 sats: stable throughout and currently acceptable Complications: No apparent complications Patient did tolerate procedure well. Bilateral Breath Sounds: Rhonchi Suctioning: Oral;Airway   [name] 02/12/2012, 9:35 AM

## 2012-02-12 NOTE — Evaluation (Signed)
Clinical/Bedside Swallow Evaluation Patient Details  Name: Samuel Cole MRN: [id number] Date of Birth: 19-Oct-1970  Today's Date: 02/12/2012 Time: 4098-1191 SLP Time Calculation (min): 21 min  Past Medical History: History reviewed. No pertinent past medical history. Past Surgical History: History reviewed. No pertinent past surgical history. HPI:  41 year old male with no significant PMH except for marijuana abuse. Presented with 3 to 4 days history of sore throat, non productive cough, odynophagia, fever, chills and malaise. Symptoms progressed and became SOB with stridor, hypoxemia and became unconscious. The first intubation attempt in the CT scan was unsuccessful but they were able to ventilate him well with the bag. He was taken back to the ER and was intubated by anesthesia and ENT. Apparently there was severe epiglottitis and laryngeal edema with purulent secretions; PNA.  Pt successfuly extubated this am.  ENT scoped revealing improved epiglottitis and pharyngitis.     Assessment / Plan / Recommendation Clinical Impression  Pt with normal oropharyngeal swallow; no evidence of impariments.  Rec initiating regular consistency diet, thin liquids.           Diet Recommendation Regular;Thin liquid   Liquid Administration via: Cup;Straw Medication Administration: Whole meds with liquid Supervision: Patient able to self feed    Other  Recommendations   none  Follow Up Recommendations  None          Samuel Cole L. Samuel Cole, [place] CCC/SLP Pager [phone]   Samuel Cole Samuel Cole 02/12/2012,3:25 PM

## 2012-02-12 NOTE — Progress Notes (Signed)
Name: ABDALLA NARAMORE MRN: [id number] DOB: 10-Jan-1971    LOS: 4  PULMONARY / CRITICAL CARE MEDICINE  HPI:   41 year old male with no significant PMH except for marijuana abuse. Presents with 3 to 4 days history of sore throat, non productive cough, odynophagia, fever, chills and malaise. He came to the ED yesterday and was diagnosed with pharyngitis and was sent home on prednisone, naproxen and tramadol. Over the last 24 hrs his symptoms progressed and became SOB with stridor. 911 was called and was brought to the ED on supplemental O2. At admission there was no stridor and was saturating 100% on RA. He was sent for a CT of the neck and while on the CT he developed stridor, hypoxemia and became unconscious. The first intubation attempt in the CT scan was unsuccessful but they were able to ventilate him well with the bag. He was taken back to the ER and was intubated by anesthesia and ENT. Apparently there was severe epiglottis and laryngeal edema with purulent secretions. Once intubated the patient remained hemodynamically stable.   Brief Patient Summary: 41 yo man with little PMH, admitted with pharyngitis that progressed to severe epiglottitis requiring intubation for airway protection.  CT scan neck without abscess.   Subjective:  Better cuff leak today   Vital Signs: Temp:  [98.4 F (36.9 C)-100.4 F (38 C)] 98.8 F (37.1 C) (07/09 0800) Pulse Rate:  [51-99] 62  (07/09 0800) Resp:  [12-32] 12  (07/09 0800) BP: (100-160)/(6-85) 100/52 mmHg (07/09 0747) SpO2:  [96 %-100 %] 97 % (07/09 0800) FiO2 (%):  [30 %-40 %] 30 % (07/09 0800) Weight:  [69.9 kg (154 lb 1.6 oz)] 69.9 kg (154 lb 1.6 oz) (07/09 0500)  Intake/Output Summary (Last 24 hours) at 02/12/12 0901 Last data filed at 02/12/12 0700  Gross per 24 hour  Intake 1825.11 ml  Output   1120 ml  Net 705.11 ml     Physical Examination: General:  Intubated, mechanically ventilated, no acute distress Neuro:  Awake, able to follow  commands, synchronous, nonfocal HEENT:  PERRL, pink conjunctivae, moist membranes Neck:  Supple, no JVD   Cardiovascular:  RRR, no M/R/G Lungs:  CTA, no W/R/R Abdomen:  Soft, nontender, nondistended, bowel sounds present Musculoskeletal:  Moves all extremities, no pedal edema Skin:  No rash    ASSESSMENT AND PLAN 41 year old male with no significant PMH. Presents with 4 days history of severe sore throat, fever, chills, odynophagia. In the ER he developed stridor and hypoxemia that required intubation for airway protection. Laryngoscopy revealed severely swollen epiglottis and larynx. There were purulent secretions. The chest X ray shows bilateral infiltrates compatible with pneumonia. Less likely non cardiogenic pulmonary edema. Blood work showed markedly elevated WBC.  PULMONARY  Lab 02/09/12 0450 02/08/12 2000  PHART 7.365 7.316*  PCO2ART 34.5* 43.9  PO2ART 67.5* 75.0*  HCO3 19.0* 22.4  O2SAT 92.6 93.0   Ventilator Settings: Vent Mode:  [-] CPAP;PSV FiO2 (%):  [30 %-40 %] 30 % Set Rate:  [16 bmp] 16 bmp Vt Set:  [53 mL-530 mL] 53 mL PEEP:  [5 cmH20] 5 cmH20 Pressure Support:  [5 cmH20-10 cmH20] 5 cmH20 Plateau Pressure:  [17 cmH20-18 cmH20] 18 cmH20 CXR: 7/8 Bibasilar atx vs infiltrates ETT:  7/5>>>  A:   Acute respiratory failure Epiglottitis and laryngitis with severe edema compromising the airway. Possible Community acquired pneumonia.  P:   - goal extubation 7/9, reviewed case with Dr Suszanne Conners this am - VAP  precautions - see ID - Solumedrol 60 mg IV BID, wean post extubation - Respiratory viral panel still pending - ENT input appreciated.  CARDIOVASCULAR  Lab 02/08/12 2049 02/08/12 2048  TROPONINI -- --  LATICACIDVEN 3.0* --  PROBNP -- 212.6*   ECG:  Normal sinus rhythm Lines: Peripheral lines  A:  No issues P:    RENAL  Lab 02/12/12 0537 02/11/12 0429 02/10/12 0410 02/09/12 0400 02/09/12 0039 02/08/12 1559  NA 142 139 138 133* -- 136  K 3.7 4.0 --  -- -- --  CL 105 105 104 104 -- 99  CO2 28 27 24 19  -- 23  BUN 22 17 14 13  -- 13  CREATININE 1.07 1.16 1.00 0.97 0.98 --  CALCIUM 8.9 8.9 9.2 8.2* -- 9.6  MG -- -- -- -- -- --  PHOS -- -- -- -- -- --   Intake/Output      07/08 0701 - 07/09 0700 07/09 0701 - 07/10 0700   I.V. (mL/kg) 688.9 (9.9)    NG/GT 725    IV Piggyback 810    Total Intake(mL/kg) 2223.9 (31.8)    Urine (mL/kg/hr) 1720 (1)    Total Output 1720    Net +503.9          Foley:  7/5>>>  A:   Normal kidney function P:   Monitor BMP  GASTROINTESTINAL No results found for this basename: AST:5,ALT:5,ALKPHOS:5,BILITOT:5,PROT:5,ALBUMIN:5 in the last 168 hours  A:   No issues P:   - GI prophylaxis with protonix IV - NPO - panda, tube feedings held for probable extubation  HEMATOLOGIC  Lab 02/12/12 0537 02/11/12 0429 02/10/12 0410 02/09/12 0400 02/09/12 0039 02/08/12 2049  HGB 12.0* 13.2 13.8 14.0 15.4 --  HCT 35.2* 36.9* 40.6 39.9 44.3 --  PLT 244 232 209 211 228 --  INR -- -- -- -- -- 1.12  APTT -- -- -- -- -- --   A:   No issues except for elevated WBC on steroids P:  - Follow CBC  INFECTIOUS  Lab 02/12/12 0537 02/11/12 0429 02/10/12 0410 02/09/12 0400 02/09/12 0039  WBC 14.6* 18.2* 23.2* 28.5* 32.5*  PROCALCITON -- -- -- -- --   Cultures: BCx2 7/5>>> UC 7/5>>> Resp Culture 7/5>>> no organisms Resp viral panel 7/7 >> 7/6 HIV >> negative  Antibiotics: - Zosyn 7/5>>> - Vancomycin 7/5>>> - Azithromycin 7/5>>>  A:   Epiglottitis and laryngitis Possible Community acquired pneumonia (doubt)  P:   - Antibiotics as above, will wean quickly as I doubt this was a CAP - Appreciate ENT input  ENDOCRINE  Lab 02/09/12 0041  GLUCAP 176*   A:   No issues    NEUROLOGIC  A:   No issues P:   - Continuous sedation with propofol >> off this am for extubation - Fentanyl PRN for pain control - Daily awakening.   BEST PRACTICE / DISPOSITION - Level of Care:  ICU - Primary Service:   CCM - Consultants:  ENT - Code Status:  Full code - Diet:  TF's - DVT Px:  Heparin - GI Px:  Protonix - Skin Integrity:  Intact - Social / Family:  Updated at bedside.  CC time 60 minutes  Levy Pupa, MD, PhD 02/12/2012, 9:01 AM [facility] [phone] or if no answer [phone]

## 2012-02-13 ENCOUNTER — Encounter (HOSPITAL_COMMUNITY): Payer: Self-pay | Admitting: Certified Registered Nurse Anesthetist

## 2012-02-13 ENCOUNTER — Inpatient Hospital Stay (HOSPITAL_COMMUNITY): Payer: Self-pay

## 2012-02-13 LAB — BASIC METABOLIC PANEL
BUN: 20 mg/dL (ref 6–23)
Calcium: 8.9 mg/dL (ref 8.4–10.5)
Chloride: 105 mEq/L (ref 96–112)
Creatinine, Ser: 1.08 mg/dL (ref 0.50–1.35)
GFR calc Af Amer: >90 mL/min (ref >90)

## 2012-02-13 LAB — CBC
Hemoglobin: 12.8 g/dL — ABNORMAL LOW (ref 13.0–17.0)
RBC: 4.02 MIL/uL — ABNORMAL LOW (ref 4.22–5.81)

## 2012-02-13 MED ORDER — PREDNISONE 10 MG PO TABS: 10.0000 mg | ORAL_TABLET | Freq: Every day | ORAL | Status: DC

## 2012-02-13 MED ORDER — LEVOFLOXACIN 750 MG PO TABS
750.0000 mg | ORAL_TABLET | Freq: Every day | ORAL | Status: AC
  Administered 2012-02-13 – 2012-02-14 (×2): 750 mg via ORAL
  Filled 2012-02-13 (×3): qty 1

## 2012-02-13 MED ORDER — PREDNISONE 20 MG PO TABS: 40.0000 mg | ORAL_TABLET | Freq: Every day | ORAL | Status: DC

## 2012-02-13 MED ORDER — BIOTENE DRY MOUTH MT LIQD
15.0000 mL | Freq: Two times a day (BID) | OROMUCOSAL | Status: DC
  Administered 2012-02-13 (×2): 15 mL via OROMUCOSAL

## 2012-02-13 MED ORDER — PREDNISONE 20 MG PO TABS
30.0000 mg | ORAL_TABLET | Freq: Every day | ORAL | Status: DC
  Administered 2012-02-14: 30 mg via ORAL

## 2012-02-13 MED ORDER — PREDNISONE 20 MG PO TABS: 20.0000 mg | ORAL_TABLET | Freq: Every day | ORAL | Status: DC

## 2012-02-13 MED ORDER — PREDNISONE 50 MG PO TABS
50.0000 mg | ORAL_TABLET | Freq: Every day | ORAL | Status: DC
  Administered 2012-02-13 – 2012-02-14 (×2): 50 mg via ORAL
  Filled 2012-02-13 (×6): qty 1

## 2012-02-13 NOTE — Progress Notes (Signed)
Name: Samuel Cole MRN: [id number] DOB: 1971-07-29    LOS: 5  PULMONARY / CRITICAL CARE MEDICINE  Brief Patient Summary: 41 yo man with little PMH, admitted with pharyngitis that progressed to severe epiglottitis requiring intubation for airway protection.  CT scan neck without abscess.   Subjective:  Able to tolerate PO without difficulty. No reported SOB.    Vital Signs: Temp:  [97.5 F (36.4 C)-100.1 F (37.8 C)] 98.1 F (36.7 C) (07/10 0400) Pulse Rate:  [58-76] 62  (07/10 0700) Resp:  [12-25] 23  (07/10 0700) BP: (99-134)/(52-84) 133/72 mmHg (07/10 0700) SpO2:  [93 %-100 %] 96 % (07/10 0700) FiO2 (%):  [30 %] 30 % (07/09 0800) Weight:  [153 lb 10.6 oz (69.7 kg)] 153 lb 10.6 oz (69.7 kg) (07/10 0500)  Intake/Output Summary (Last 24 hours) at 02/13/12 0729 Last data filed at 02/13/12 0600  Gross per 24 hour  Intake 1362.5 ml  Output    350 ml  Net 1012.5 ml     Physical Examination: General:  NAD, resting comfortably in bed Neuro:  Awake, follows commands HEENT:  PERRL, pink conjunctivae, moist membranes Cardiovascular:  RRR, no M/R/G Lungs:  CTA, no W/R/R Abdomen:  Soft, nontender, nondistended, bowel sounds present Musculoskeletal:  Moves all extremities, no pedal edema Skin:  No rash, warm    ASSESSMENT AND PLAN 41 year old male with no significant PMH. Presents with 4 days history of severe sore throat, fever, chills, odynophagia. In the ER he developed stridor and hypoxemia that required intubation for airway protection. Laryngoscopy revealed severely swollen epiglottis and larynx. There were purulent secretions. The chest X ray shows bilateral infiltrates compatible with pneumonia. Less likely non cardiogenic pulmonary edema. Blood work showed markedly elevated WBC.  PULMONARY  Lab 02/09/12 0450 02/08/12 2000  PHART 7.365 7.316*  PCO2ART 34.5* 43.9  PO2ART 67.5* 75.0*  HCO3 19.0* 22.4  O2SAT 92.6 93.0   Ventilator Settings: Vent Mode:  [-]  Stand-by FiO2 (%):  [30 %] 30 % PEEP:  [5 cmH20] 5 cmH20 Pressure Support:  [5 cmH20] 5 cmH20 CXR: 7/8 Bibasilar atx vs infiltrates ETT:  7/5>>> 7/9  A:   Acute respiratory failure Epiglottitis and laryngitis with severe edema compromising the airway. Possible Community acquired pneumonia.  P:   - extubation on 7/9-patient tolerated well -monitored in ICU overnight, transfer to floor  7/10.  - Solumedrol 60 mg IV BID, transitioning to Prednisone 50mg  today with plan for taper - see ID -CXR with hazy opacities in bilateral bases-suspect atelectasis cannot rule out infiltrate; based on clinical course doubt CAP - Respiratory viral panel negative - ENT input appreciated-repeat laryngoscopy 7/9 showed significant improvement in epiglottitis and laryngeal edema  CARDIOVASCULAR  Lab 02/08/12 2049 02/08/12 2048  TROPONINI -- --  LATICACIDVEN 3.0* --  PROBNP -- 212.6*   ECG:  Normal sinus rhythm Lines: Peripheral lines  A:  No issues P:    RENAL  Lab 02/13/12 0400 02/12/12 0537 02/11/12 0429 02/10/12 0410 02/09/12 0400  NA 142 142 139 138 133*  K 3.5 3.7 -- -- --  CL 105 105 105 104 104  CO2 29 28 27 24 19   BUN 20 22 17 14 13   CREATININE 1.08 1.07 1.16 1.00 0.97  CALCIUM 8.9 8.9 8.9 9.2 8.2*  MG -- -- -- -- --  PHOS -- -- -- -- --   Intake/Output      07/09 0701 - 07/10 0700 07/10 0701 - 07/11 0700   P.O. 470  I.V. (mL/kg) 270 (3.9)    NG/GT 75    IV Piggyback 547.5    Total Intake(mL/kg) 1362.5 (19.5)    Urine (mL/kg/hr) 350 (0.2)    Total Output 350    Net +1012.5         Stool Occurrence 1 x     Foley:  7/5>>>7/9  A:   Normal kidney function P:   Monitor BMP No active issues  GASTROINTESTINAL No results found for this basename: AST:5,ALT:5,ALKPHOS:5,BILITOT:5,PROT:5,ALBUMIN:5 in the last 168 hours  A:   No issues P:   - Regular diet.  -discontinued PPI  HEMATOLOGIC  Lab 02/13/12 0400 02/12/12 0537 02/11/12 0429 02/10/12 0410 02/09/12 0400  02/08/12 2049  HGB 12.8* 12.0* 13.2 13.8 14.0 --  HCT 36.1* 35.2* 36.9* 40.6 39.9 --  PLT 236 244 232 209 211 --  INR -- -- -- -- -- 1.12  APTT -- -- -- -- -- --   A:   No issues except for elevated WBC on steroids P:  - Follow CBC  INFECTIOUS  Lab 02/13/12 0400 02/12/12 0537 02/11/12 0429 02/10/12 0410 02/09/12 0400  WBC 19.4* 14.6* 18.2* 23.2* 28.5*  PROCALCITON -- -- -- -- --   Cultures: BCx2 7/5>>> Resp Culture 7/5>>> no organisms Resp viral panel 7/7 >> Negative 7/6 HIV >> negative  Antibiotics: - Zosyn 7/5>>> 7/10 - Vancomycin 7/5>>> 7/10 - Azithromycin 7/5>>> 7/10 - levaquin 7/10 >>   A:   Epiglottitis and laryngitis Possible Community acquired pneumonia (doubt based on clinical hx and exam)  P:   - Antibiotics as above -doubt CAP, will convert to epiglottitis regimen (no evidence abscess on ENT eval or CT scan) - will start levaquin to finish duration of 7 days total (7/10 is day 5 of 7) - Appreciate ENT input  ENDOCRINE  Lab 02/09/12 0041  GLUCAP 176*   A:  Not a known diabetic No issues    NEUROLOGIC  A:   No issues  BEST PRACTICE / DISPOSITION - Level of Care:  ICU >> to floor  - Primary Service:  CCM - Consultants:  ENT - Code Status:  Full code - Diet:  PO diet - DVT Px:  Heparin - GI Px:  Protonix - Skin Integrity:  Intact - Social / Family:  Updated at bedside.   Levy Pupa, MD, PhD 02/13/2012, 7:29 AM [facility] [phone] or if no answer [phone]

## 2012-02-13 NOTE — Progress Notes (Signed)
Subjective: Pt resting comfortably in bed. Alert and responsive. No c/o.  Objective: Vital signs in last 24 hours: Temp:  [97.5 F (36.4 C)-100.1 F (37.8 C)] 98.1 F (36.7 C) (07/10 0400) Pulse Rate:  [58-72] 62  (07/10 0700) Resp:  [12-25] 23  (07/10 0700) BP: (99-134)/(52-84) 133/72 mmHg (07/10 0700) SpO2:  [93 %-100 %] 96 % (07/10 0700) Weight:  [69.7 kg (153 lb 10.6 oz)] 69.7 kg (153 lb 10.6 oz) (07/10 0500)  Physical examination:  The patient is alert and oriented x3. He appears well-nourished and well-developed. No distress  Eye: No conjunctival injection or drainage. Lids were normal.  His pupils are equal, round, reactive to light. Extraocular motion cannot be determined. Examination of the ears shows normal auricles and external auditory canals bilaterally. Nasal examination shows normal mucosa, septum, turbinates. Facial examination shows no asymmetry. Oral cavity examination shows normal mucosa. Palpation of the neck reveals no significant lymphadenopathy or mass. The trachea is midline. The thyroid is not significantly enlarged. Cranial nerves could not be ascertained.  Skin: Skin is warm and dry. No rash noted.   Basename 02/13/12 0400 02/12/12 0537  WBC 19.4* 14.6*  HGB 12.8* 12.0*  HCT 36.1* 35.2*  PLT 236 244    Basename 02/13/12 0400 02/12/12 0537  NA 142 142  K 3.5 3.7  CL 105 105  CO2 29 28  GLUCOSE 150* 191*  BUN 20 22  CREATININE 1.08 1.07  CALCIUM 8.9 8.9    Medications:  I have reviewed the patient's current medications. Scheduled:   . antiseptic oral rinse  15 mL Mouth Rinse BID  . heparin  5,000 Units Subcutaneous Q8H  . levofloxacin  750 mg Oral Daily  . nicotine  21 mg Transdermal Daily  . predniSONE  50 mg Oral Q breakfast   Followed by  . predniSONE  40 mg Oral Q breakfast   Followed by  . predniSONE  30 mg Oral Q breakfast   Followed by  . predniSONE  20 mg Oral Q breakfast   Followed by  . predniSONE  10 mg Oral Q breakfast  .  DISCONTD: antiseptic oral rinse  15 mL Mouth Rinse QID  . DISCONTD: azithromycin  500 mg Intravenous Q24H  . DISCONTD: chlorhexidine  15 mL Mouth Rinse BID  . DISCONTD: etomidate  30 mg Intravenous Once  . DISCONTD: feeding supplement (OXEPA)  1,000 mL Per Tube Q24H  . DISCONTD: feeding supplement  30 mL Per Tube BID  . DISCONTD: methylPREDNISolone (SOLU-MEDROL) injection  125 mg Intravenous Once  . DISCONTD: methylPREDNISolone (SOLU-MEDROL) injection  60 mg Intravenous Q12H  . DISCONTD: midazolam  3 mg Intravenous Once  . DISCONTD: pantoprazole (PROTONIX) IV  40 mg Intravenous QHS  . DISCONTD: piperacillin-tazobactam (ZOSYN)  IV  3.375 g Intravenous Q8H  . DISCONTD: succinylcholine  1.5 mg/kg Intravenous Once  . DISCONTD: vancomycin  1,000 mg Intravenous Q12H    Assessment/Plan: Pt continues to improve clinically. Consider switching to po steroid and abx. Increased WBC may be secondary to use of steroid. Will follow.   LOS: 5 days   Samuel Cole,SUI W 02/13/2012, 10:21 AM

## 2012-02-13 NOTE — Progress Notes (Signed)
Nutrition Follow-up  Intervention:  No intervention needed at this time.  Continue regular diet.  Assessment:   Patient was extubated yesterday.  Transferring to a regular floor today.  Patient reports that he is eating well.  Couldn't eat much at breakfast today, because he is not used to eating that early in the morning since he works third shift.  Declines snacks and supplements.  Diet Order:  Regular diet   Meds: Scheduled Meds:   . antiseptic oral rinse  15 mL Mouth Rinse BID  . heparin  5,000 Units Subcutaneous Q8H  . levofloxacin  750 mg Oral Daily  . nicotine  21 mg Transdermal Daily  . predniSONE  50 mg Oral Q breakfast   Followed by  . predniSONE  40 mg Oral Q breakfast   Followed by  . predniSONE  30 mg Oral Q breakfast   Followed by  . predniSONE  20 mg Oral Q breakfast   Followed by  . predniSONE  10 mg Oral Q breakfast  . DISCONTD: antiseptic oral rinse  15 mL Mouth Rinse QID  . DISCONTD: azithromycin  500 mg Intravenous Q24H  . DISCONTD: chlorhexidine  15 mL Mouth Rinse BID  . DISCONTD: etomidate  30 mg Intravenous Once  . DISCONTD: feeding supplement (OXEPA)  1,000 mL Per Tube Q24H  . DISCONTD: feeding supplement  30 mL Per Tube BID  . DISCONTD: methylPREDNISolone (SOLU-MEDROL) injection  125 mg Intravenous Once  . DISCONTD: methylPREDNISolone (SOLU-MEDROL) injection  60 mg Intravenous Q12H  . DISCONTD: midazolam  3 mg Intravenous Once  . DISCONTD: pantoprazole (PROTONIX) IV  40 mg Intravenous QHS  . DISCONTD: piperacillin-tazobactam (ZOSYN)  IV  3.375 g Intravenous Q8H  . DISCONTD: succinylcholine  1.5 mg/kg Intravenous Once  . DISCONTD: vancomycin  1,000 mg Intravenous Q12H   Continuous Infusions:   . DISCONTD: propofol 10 mcg/kg/min (02/12/12 9604)   PRN Meds:.sodium chloride, albuterol, DISCONTD: fentaNYL, DISCONTD: LORazepam, DISCONTD: midazolam  Labs:  CMP     Component Value Date/Time   NA 142 02/13/2012 0400   K 3.5 02/13/2012 0400   CL 105  02/13/2012 0400   CO2 29 02/13/2012 0400   GLUCOSE 150* 02/13/2012 0400   BUN 20 02/13/2012 0400   CREATININE 1.08 02/13/2012 0400   CALCIUM 8.9 02/13/2012 0400   GFRNONAA 85* 02/13/2012 0400   GFRAA >90 02/13/2012 0400     Intake/Output Summary (Last 24 hours) at 02/13/12 1024 Last data filed at 02/13/12 0600  Gross per 24 hour  Intake    960 ml  Output    200 ml  Net    760 ml    Weight Status:  69.7 kg (stable)  Re-estimated needs:  1950-2150 kcals, 84-98 grams protein, 2-2.2 liters fluid daily  Nutrition Dx:  Inadequate oral intake, progressing.  Goal:  Intake to meet >90% of estimated nutrition needs, progressing.  Monitor:  PO intake, weigh trend, labs.   Joaquin Courts, RD, CNSC, LDN Pager# [phone] After Hours Pager# [phone]

## 2012-02-14 MED ORDER — LEVOFLOXACIN 750 MG PO TABS: 750.0000 mg | ORAL_TABLET | Freq: Every day | ORAL | Status: AC

## 2012-02-14 MED ORDER — PREDNISONE 10 MG PO TABS: ORAL_TABLET | ORAL | Status: DC

## 2012-02-14 NOTE — Discharge Summary (Signed)
Physician Discharge Summary     Patient ID: Samuel Cole MRN: [id number] DOB/AGE: [birth year] 41 y.o.  Admit date: 02/08/2012 Discharge date: 02/14/2012  Admission Diagnoses: Acute respiratory failure. PNA. Epiglottis  Discharge Diagnoses:  Principal Problem:  *Epiglottitis Active Problems:  Acute respiratory failure with hypoxia  CAP (community acquired pneumonia)  Protein calorie malnutrition   Brief history 41 yo man with little PMH, admitted with pharyngitis that progressed to severe epiglottitis requiring intubation for airway protection. CT scan neck without abscess.   Hospital Course:  Acute respiratory failure  Epiglottitis and laryngitis with severe edema compromising the airway.  Possible Community acquired pneumonia. Admitted on 7/5 with 24 hours of progressive dyspnea after being seen the day prior in ED with dx of pharyngitis. Developed Stridor while evaluating CT neck to r/o abscess. This was associated w/ hypoxemia and loss of LOC. He was emergently intubated (by ENT and anesthesia) there was severe epiglottis and laryngeal edema with purulent secretion.  PCCM asked to admit. He was supported with mechanical ventilator, systemic steroids, and  empiric antibiotics to cover for CAP and aspiration. ENT followed along clinically. He was successfully extubated on 7/9. Since has been making steady progress.  Plan: We will d/c him to home with pred taper and 7 day rx of Levaquin F/u our office in 2 weeks Discharge Exam: BP 131/76  Pulse 66  Temp 98.5 F (36.9 C) (Oral)  Resp 20  Ht 5\' 10"  (1.778 m)  Wt 69.7 kg (153 lb 10.6 oz)  BMI 22.05 kg/m2  SpO2 98%  Physical Examination:  General: NAD, resting comfortably in bed  Neuro: Awake, follows commands  HEENT: PERRL, pink conjunctivae, moist membranes  Cardiovascular: RRR, no M/R/G  Lungs: CTA, no W/R/R  Abdomen: Soft, nontender, nondistended, bowel sounds present  Musculoskeletal: Moves all extremities, no pedal  edema  Skin: No rash, warm    Labs at discharge Lab Results  Component Value Date   CREATININE 1.08 02/13/2012   BUN 20 02/13/2012   NA 142 02/13/2012   K 3.5 02/13/2012   CL 105 02/13/2012   CO2 29 02/13/2012   Lab Results  Component Value Date   WBC 19.4* 02/13/2012   HGB 12.8* 02/13/2012   HCT 36.1* 02/13/2012   MCV 89.8 02/13/2012   PLT 236 02/13/2012   No results found for this basename: ALT, AST, GGT, ALKPHOS, BILITOT   Lab Results  Component Value Date   INR 1.12 02/08/2012    Current radiology studies Dg Chest Port 1 View  02/13/2012  *RADIOLOGY REPORT*  Clinical Data: Shortness of breath.  Infiltrates.  PORTABLE CHEST - 1 VIEW  Comparison: Chest 02/12/2012 and 02/11/2012.  Findings: NG tube and NG tube have been removed.  Hazy basilar airspace opacities have improved.  No pneumothorax or pleural fluid.  Heart size upper normal.  IMPRESSION:  1.  Improved basilar airspace opacities. 2.  Status post extubation and NG tube removal.  Original Report Authenticated By: Bernadene Bell. Maricela Curet, M.D.    Disposition:  01-Home or Self Care   Medication List  As of 02/14/2012  9:24 AM   ASK your doctor about these medications         ibuprofen 200 MG tablet   Commonly known as: ADVIL,MOTRIN   Take 400 mg by mouth every 6 (six) hours as needed. For pain      naproxen 500 MG tablet   Commonly known as: NAPROSYN   Take 500 mg by mouth 2 (two) times daily.  PredniSONE 5 MG Kit   Take 1 kit by mouth daily after breakfast. Prednisone 5 mg 6 day dosepack.  Take as directed.      traMADol 50 MG tablet   Commonly known as: ULTRAM   Take 100 mg by mouth every 8 (eight) hours as needed. For pain             Discharged Condition: good, no dyspnea.   Physician Statement:   The Patient was personally examined, the discharge assessment and plan has been personally reviewed and I agree with ACNP Babcock's assessment and plan. > 30 minutes of time have been dedicated to discharge  assessment, planning and discharge instructions.   SignedAnders Simmonds 02/14/2012, 9:24 AM  Attending: Yolonda Kida PCCM Pager: [phone] Cell: [phone] If no response, call [phone]

## 2012-02-14 NOTE — Progress Notes (Signed)
Subjective: Pt resting comfortably in bed. No breathing difficulty.  Alert and responsive. No c/o.  Objective: Vital signs in last 24 hours: Temp:  [97.2 F (36.2 C)-98.8 F (37.1 C)] 98.5 F (36.9 C) (07/11 0619) Pulse Rate:  [54-96] 66  (07/11 0619) Resp:  [19-24] 20  (07/11 0619) BP: (121-137)/(66-87) 131/76 mmHg (07/11 0619) SpO2:  [95 %-100 %] 98 % (07/11 7846)  Physical examination:  The patient is alert and oriented x3. He appears well-nourished and well-developed. No distress  Eye: No conjunctival injection or drainage. Lids were normal.  His pupils are equal, round, reactive to light. EOMI. Examination of the ears shows normal auricles and external auditory canals bilaterally. Nasal examination shows normal mucosa, septum, turbinates. Facial examination shows no asymmetry. Oral cavity examination shows normal mucosa. No significant erythema. Palpation of the neck reveals no significant lymphadenopathy or mass. The trachea is midline. The thyroid is not significantly enlarged.  Skin: Skin is warm and dry. No rash noted.  Basename 02/13/12 0400 02/12/12 0537  WBC 19.4* 14.6*  HGB 12.8* 12.0*  HCT 36.1* 35.2*  PLT 236 244    Basename 02/13/12 0400 02/12/12 0537  NA 142 142  K 3.5 3.7  CL 105 105  CO2 29 28  GLUCOSE 150* 191*  BUN 20 22  CREATININE 1.08 1.07  CALCIUM 8.9 8.9    Medications:  I have reviewed the patient's current medications. Scheduled:   . heparin  5,000 Units Subcutaneous Q8H  . levofloxacin  750 mg Oral Daily  . nicotine  21 mg Transdermal Daily  . predniSONE  50 mg Oral Q breakfast   Followed by  . predniSONE  40 mg Oral Q breakfast   Followed by  . predniSONE  30 mg Oral Q breakfast   Followed by  . predniSONE  20 mg Oral Q breakfast   Followed by  . predniSONE  10 mg Oral Q breakfast  . DISCONTD: antiseptic oral rinse  15 mL Mouth Rinse BID    Assessment/Plan: Pt continues to improve clinically. His acute infection has mostly  resolved. Ok to d/c home on oral abx. Pt may f/u at my office on a prn basis.    LOS: 6 days   Mateen Franssen,SUI W 02/14/2012, 9:45 AM

## 2012-02-14 NOTE — ED Provider Notes (Signed)
I saw and evaluated the patient, reviewed the resident's note and I agree with the findings and plan except for the following addition: laryngoscopy performed once each by EM resident and ED attending while Pt in CT without attempt to pass ETT or bougie through suspected swollen laryngeal structures (bougie entered into oropharynx but did not attempt to pass due to distorted anatomy) yet with ability to ventilate Pt pre/during/post RSI attempt, decision made to return Pt to Resus room in main ED with notification of Anesthesia and ENT with EDPs having cric kit open and available with joint decision made by ED attending and Anesth attending to have Anesthesia attending take next look with Glidescope, similar view obtained to EDPs then with a spontaneous Pt resp, sliver of suspected airway seen without cords visualized, ETT passed by Anesth through uncertain landmarks but with O2 sat maintained and positive ETCO2 confirming intubation. ENT and Pt's family member present in Resus room during intubation.   Hurman Horn, MD 02/14/12 0030

## 2012-02-14 NOTE — Progress Notes (Signed)
DC home with family. Verbally understood DC instructions. 

## 2012-02-15 LAB — CULTURE, BLOOD (ROUTINE X 2): Culture: NO GROWTH

## 2012-03-03 ENCOUNTER — Ambulatory Visit (INDEPENDENT_AMBULATORY_CARE_PROVIDER_SITE_OTHER): Payer: Self-pay | Admitting: Adult Health

## 2012-03-03 ENCOUNTER — Ambulatory Visit (INDEPENDENT_AMBULATORY_CARE_PROVIDER_SITE_OTHER)
Admission: RE | Admit: 2012-03-03 | Discharge: 2012-03-03 | Disposition: A | Discharge status: Home / Self Care | Payer: Self-pay | Source: Ambulatory Visit | Attending: Adult Health | Admitting: Adult Health

## 2012-03-03 ENCOUNTER — Encounter: Payer: Self-pay | Admitting: Adult Health

## 2012-03-03 VITALS — BP 112/74 | HR 56 | Temp 97.1°F | Ht 69.0 in | Wt 157.6 lb

## 2012-03-03 DIAGNOSIS — J0510 Acute epiglottitis without obstruction: Secondary | ICD-10-CM

## 2012-03-03 DIAGNOSIS — J189 Pneumonia, unspecified organism: Secondary | ICD-10-CM

## 2012-03-03 NOTE — Assessment & Plan Note (Signed)
Resolved

## 2012-03-03 NOTE — Progress Notes (Signed)
  Subjective:    Patient ID: Samuel Cole, male    DOB: July 18, 1971, 41 y.o.   MRN: [id number]  HPI 03/03/2012 [facility] follow up  Admitted 7/5-7/11/13 for Acute respiratory failure secondary to  Epiglottitis and laryngitis with severe edema compromising the airway.  Possible Community acquired pneumonia.   Admitted on 7/5 with 24 hours of progressive dyspnea after being seen the day prior in ED with dx of pharyngitis. Developed Stridor while evaluating CT neck to r/o abscess. This was associated w/ hypoxemia and loss of LOC. He was emergently intubated (by ENT and anesthesia) there was severe epiglottis and laryngeal edema with purulent secretion.   He was supported with mechanical ventilator, systemic steroids, and empiric antibiotics to cover for CAP and aspiration. ENT followed along clinically. He was successfully extubated on 7/9.   Discharged on  pred taper and 7 day rx of Levaquin   Since discharge he is doing well . No sore throat or dysphagia.  No fever or chest pain.  Has quit smoking.  Still feels tired and low energy.    Review of Systems Constitutional:   No  weight loss, night sweats,  Fevers, chills, + fatigue, or  lassitude.  HEENT:   No headaches,  Difficulty swallowing,  Tooth/dental problems, or  Sore throat,                No sneezing, itching, ear ache, nasal congestion, post nasal drip,   CV:  No chest pain,  Orthopnea, PND, swelling in lower extremities, anasarca, dizziness, palpitations, syncope.   GI  No heartburn, indigestion, abdominal pain, nausea, vomiting, diarrhea, change in bowel habits, loss of appetite, bloody stools.   Resp: No shortness of breath with exertion or at rest.  No excess mucus, no productive cough,  No non-productive cough,  No coughing up of blood.  No change in color of mucus.  No wheezing.  No chest wall deformity  Skin: no rash or lesions.  GU: no dysuria, change in color of urine, no urgency or frequency.  No flank pain, no  hematuria   MS:  No joint pain or swelling.  No decreased range of motion.  No back pain.  Psych:  No change in mood or affect. No depression or anxiety.  No memory loss.         Objective:   Physical Exam GEN: A/Ox3; pleasant , NAD   HEENT:  [place]/AT,  EACs-clear, TMs-wnl, NOSE-clear, THROAT-clear, no lesions, no postnasal drip or exudate noted. , poor dentition   NECK:  Supple w/ fair ROM; no JVD; normal carotid impulses w/o bruits; no thyromegaly or nodules palpated; no lymphadenopathy.  RESP  Clear  P & A; w/o, wheezes/ rales/ or rhonchi.no accessory muscle use, no dullness to percussion  CARD:  RRR, no m/r/g  , no peripheral edema, pulses intact, no cyanosis or clubbing.  GI:   Soft & nt; nml bowel sounds; no organomegaly or masses detected.  Musco: Warm bil, no deformities or joint swelling noted.   Neuro: alert, no focal deficits noted.    Skin: Warm, no lesions or rashes         Assessment & Plan:

## 2012-03-03 NOTE — Assessment & Plan Note (Signed)
Recent admission with acute Epiglottitis with  suspected CAP  Resolved with ABX  CXR today clear  No further tx required  Congratulated on smoking cesstation  Advised on dental care

## 2012-03-03 NOTE — Patient Instructions (Addendum)
Keep up good work on not smoking .  Follow up with our office as needed.

## 2012-03-06 ENCOUNTER — Encounter: Payer: Self-pay | Admitting: *Deleted

## 2012-03-06 ENCOUNTER — Telehealth: Payer: Self-pay | Admitting: Adult Health

## 2012-03-06 NOTE — Telephone Encounter (Signed)
Letter was completed and given to pt by Katheren Shams.

## 2013-05-27 IMAGING — CR DG CHEST 1V PORT
1 series · 1 of 1 positions shown · non-contrast
Comparison: 02/10/2012.

CLINICAL DATA: Evaluate endotracheal tube.

PORTABLE CHEST - 1 VIEW

[AP]
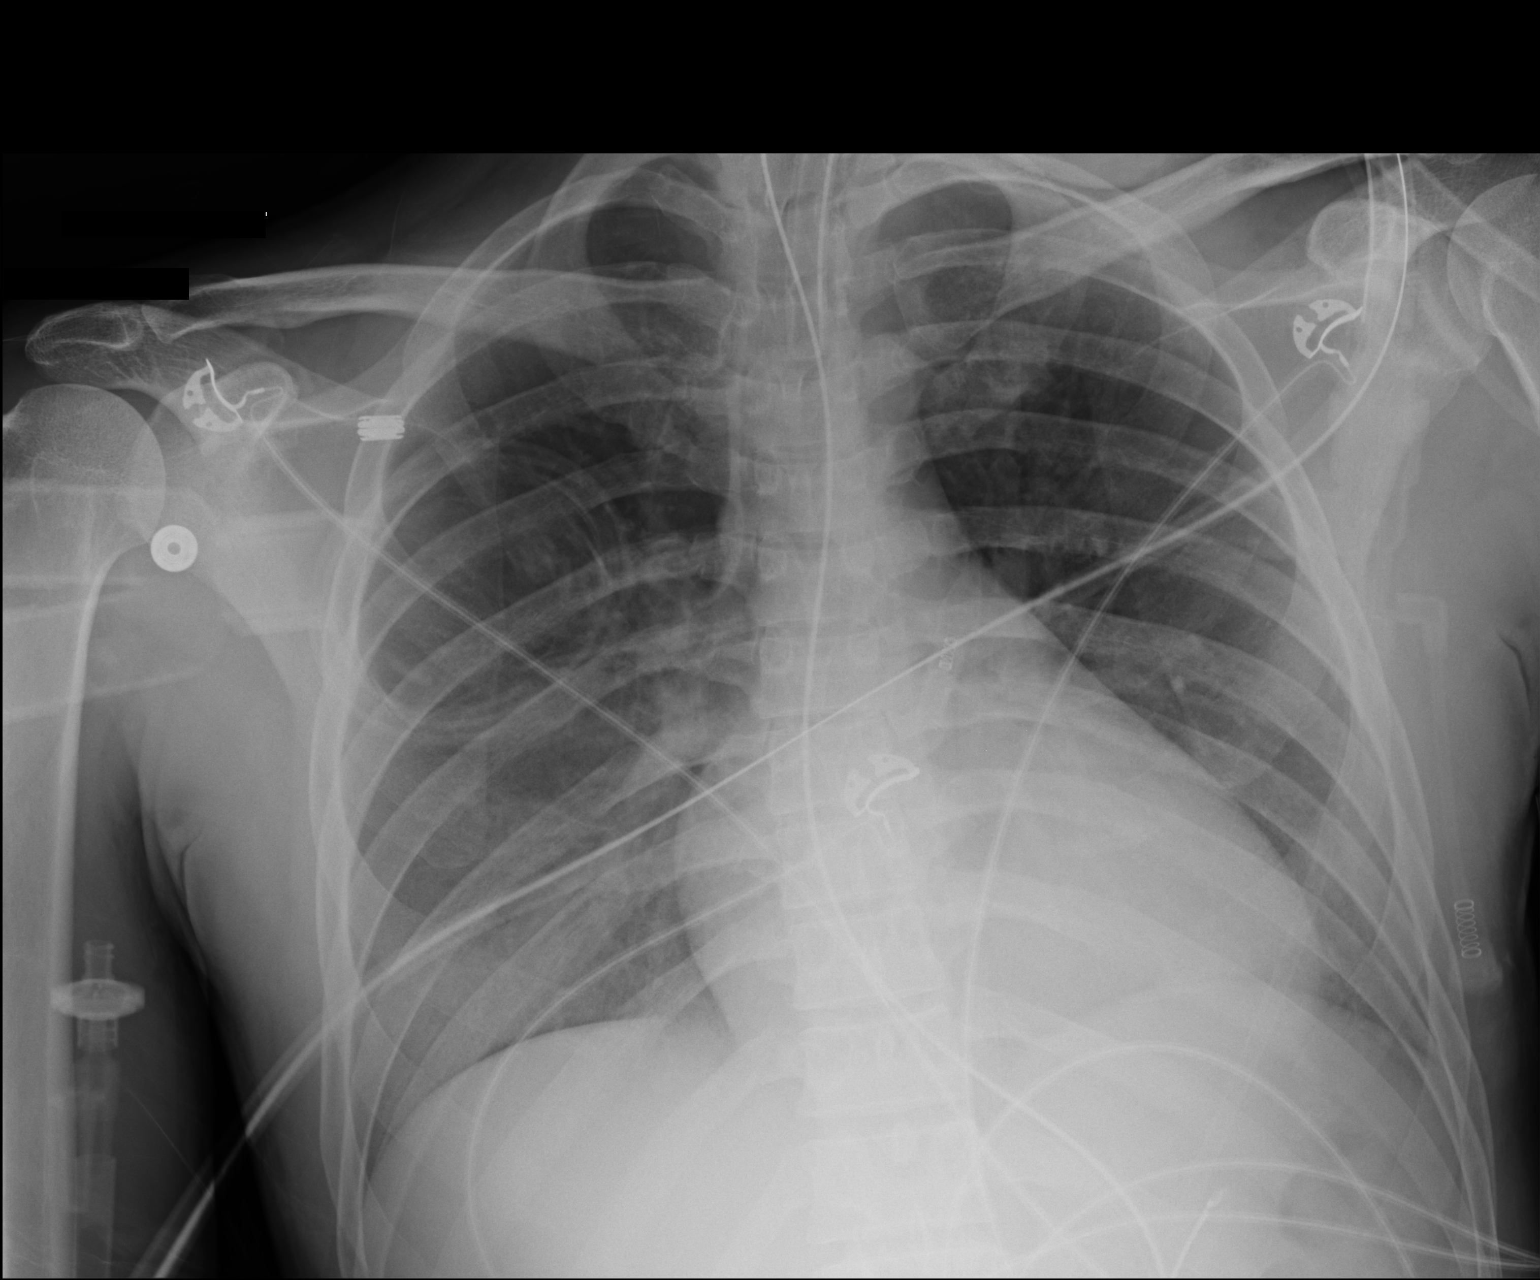

[1 of 1 positions shown; findings below may reference images not displayed]

FINDINGS: Endotracheal tube tip 7 cm above the carina.

Nasogastric tube has been placed.  Tip is seen at the level of the
gastric fundus.

Heart size top normal.

No significant change in bibasilar opacities which may represent
atelectasis and / or infiltrates.  No gross pneumothorax.
IMPRESSION: Similar appearance of the basilar consolidation.

## 2013-06-17 IMAGING — CR DG CHEST 2V
2 series · 2 of 2 positions shown · non-contrast
Comparison: 02/13/2012

CLINICAL DATA: Shortness of breath, follow up pneumonia

CHEST - 2 VIEW

[view not recorded (1 of 2)]
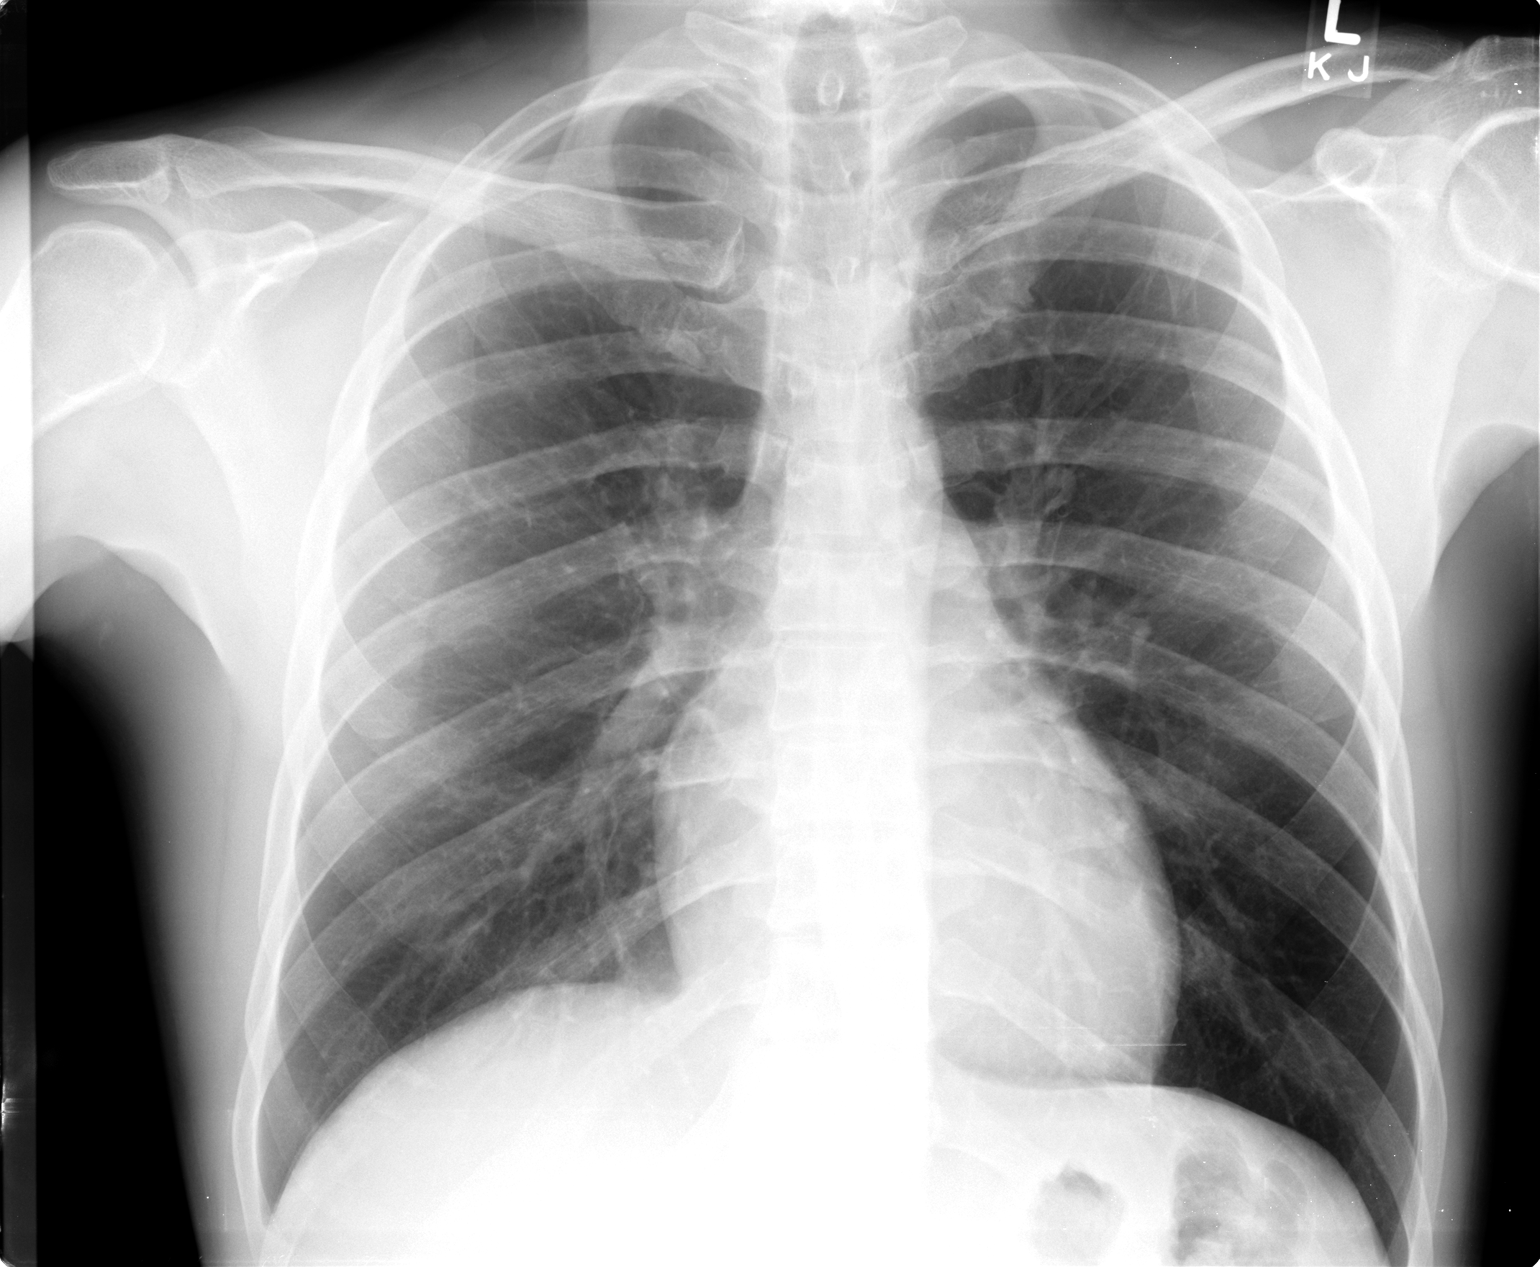

[view not recorded (2 of 2)]
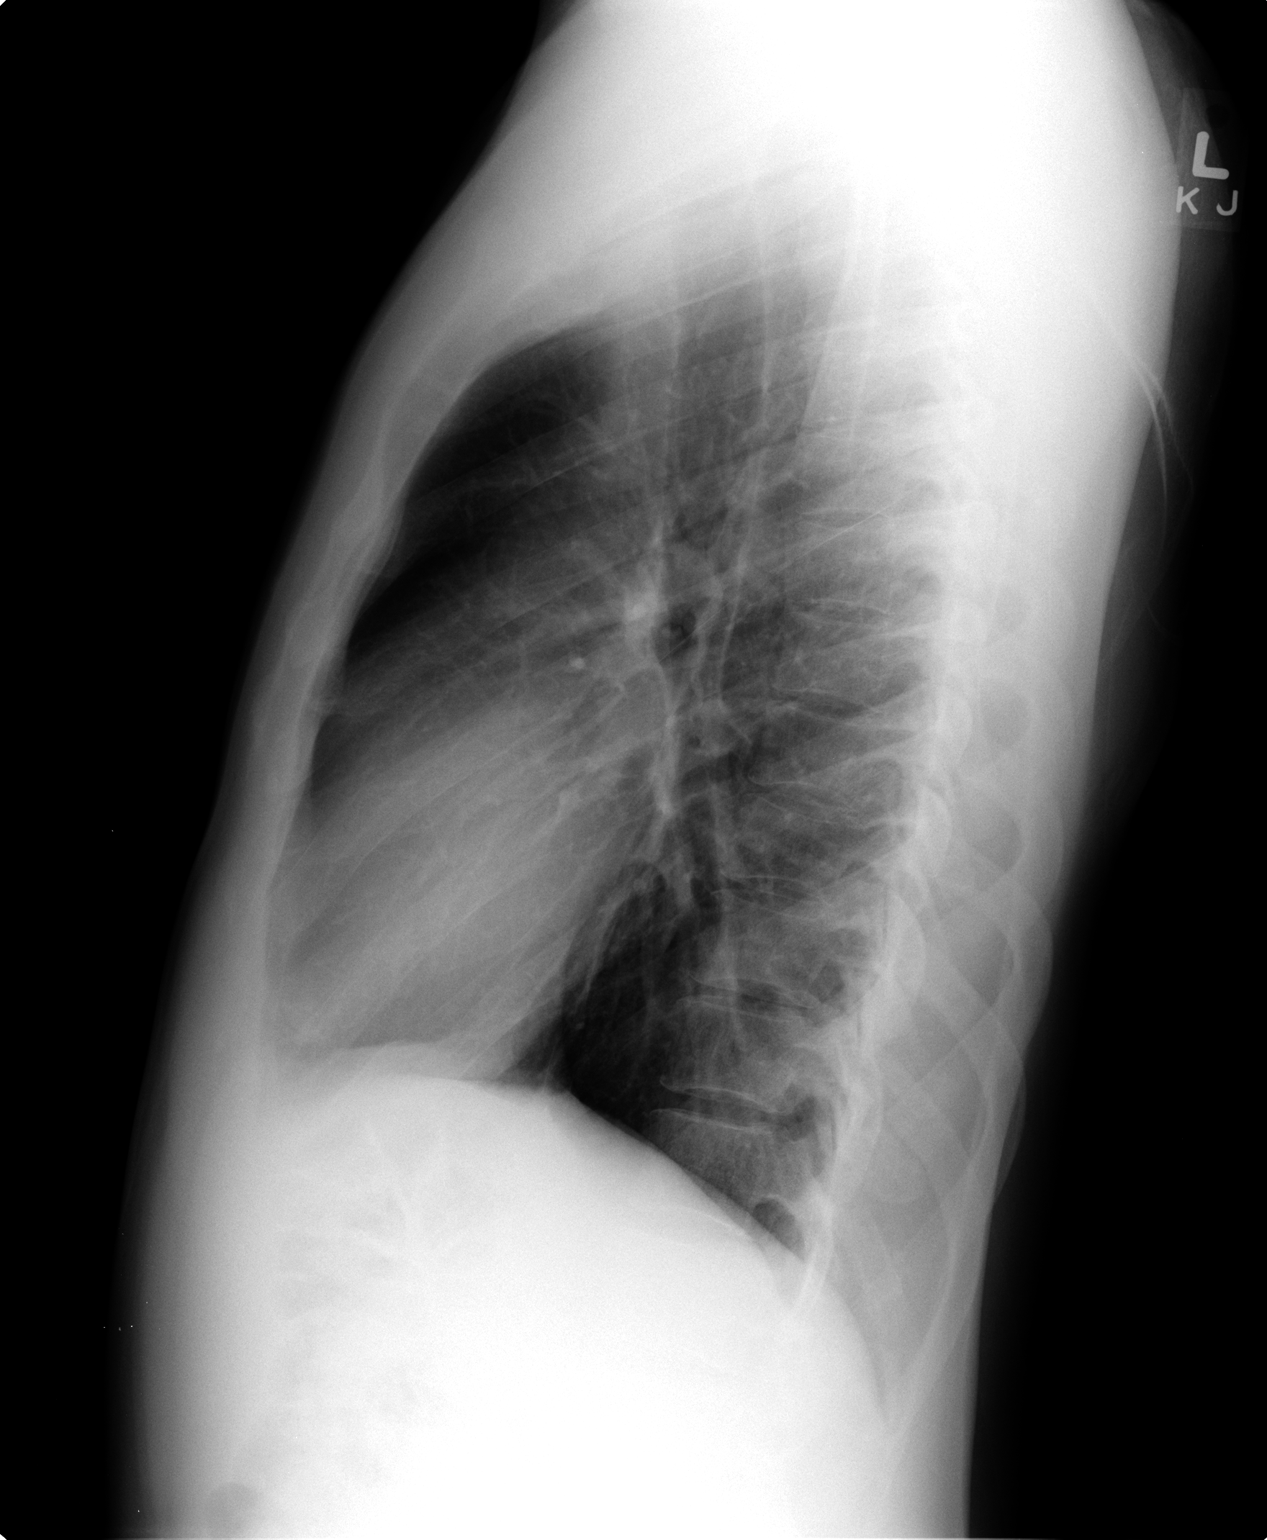

[2 of 2 positions shown; findings below may reference images not displayed]

FINDINGS: Lungs are clear. No pleural effusion or pneumothorax.

Cardiomediastinal silhouette is within normal limits.

Visualized osseous structures are within normal limits.
IMPRESSION: No evidence of acute cardiopulmonary disease.

## 2018-05-23 ENCOUNTER — Emergency Department (HOSPITAL_COMMUNITY)
Admission: EM | Admit: 2018-05-23 | Discharge: 2018-05-23 | Disposition: A | Discharge status: Home / Self Care | Payer: Self-pay | Attending: Emergency Medicine | Admitting: Emergency Medicine

## 2018-05-23 ENCOUNTER — Other Ambulatory Visit: Payer: Self-pay

## 2018-05-23 ENCOUNTER — Encounter (HOSPITAL_COMMUNITY): Payer: Self-pay | Admitting: *Deleted

## 2018-05-23 DIAGNOSIS — Z87891 Personal history of nicotine dependence: Secondary | ICD-10-CM | POA: Insufficient documentation

## 2018-05-23 DIAGNOSIS — R748 Abnormal levels of other serum enzymes: Secondary | ICD-10-CM | POA: Insufficient documentation

## 2018-05-23 DIAGNOSIS — R1033 Periumbilical pain: Secondary | ICD-10-CM | POA: Insufficient documentation

## 2018-05-23 LAB — URINALYSIS, ROUTINE W REFLEX MICROSCOPIC
Bacteria, UA: NONE SEEN
Bilirubin Urine: NEGATIVE
GLUCOSE, UA: NEGATIVE mg/dL
KETONES UR: NEGATIVE mg/dL
NITRITE: NEGATIVE
Protein, ur: NEGATIVE mg/dL
SPECIFIC GRAVITY, URINE: 1.019 (ref 1.005–1.030)
pH: 6 (ref 5.0–8.0)

## 2018-05-23 LAB — COMPREHENSIVE METABOLIC PANEL
ALK PHOS: 63 U/L (ref 38–126)
ALT: 32 U/L (ref 0–44)
ANION GAP: 9 (ref 5–15)
AST: 71 U/L — ABNORMAL HIGH (ref 15–41)
Albumin: 3.9 g/dL (ref 3.5–5.0)
BUN: 7 mg/dL (ref 6–20)
CALCIUM: 10 mg/dL (ref 8.9–10.3)
CHLORIDE: 103 mmol/L (ref 98–111)
CO2: 24 mmol/L (ref 22–32)
CREATININE: 1.05 mg/dL (ref 0.61–1.24)
Glucose, Bld: 143 mg/dL — ABNORMAL HIGH (ref 70–99)
Potassium: 3.2 mmol/L — ABNORMAL LOW (ref 3.5–5.1)
Sodium: 136 mmol/L (ref 135–145)
Total Bilirubin: 1 mg/dL (ref 0.3–1.2)
Total Protein: 7.1 g/dL (ref 6.5–8.1)

## 2018-05-23 LAB — CBC
HCT: 41.6 % (ref 39.0–52.0)
Hemoglobin: 14.5 g/dL (ref 13.0–17.0)
MCH: 31.9 pg (ref 26.0–34.0)
MCHC: 34.9 g/dL (ref 30.0–36.0)
MCV: 91.4 fL (ref 80.0–100.0)
NRBC: 0 % (ref 0.0–0.2)
PLATELETS: 272 10*3/uL (ref 150–400)
RBC: 4.55 MIL/uL (ref 4.22–5.81)
RDW: 11.9 % (ref 11.5–15.5)
WBC: 9.2 10*3/uL (ref 4.0–10.5)

## 2018-05-23 LAB — LIPASE, BLOOD: LIPASE: 312 U/L — AB (ref 11–51)

## 2018-05-23 MED ORDER — METOCLOPRAMIDE HCL 10 MG PO TABS
10.0000 mg | ORAL_TABLET | Freq: Once | ORAL | Status: AC
  Administered 2018-05-23: 10 mg via ORAL
  Filled 2018-05-23: qty 1

## 2018-05-23 MED ORDER — DICYCLOMINE HCL 20 MG PO TABS: 20.0000 mg | ORAL_TABLET | Freq: Two times a day (BID) | ORAL | 0 refills | Status: AC

## 2018-05-23 MED ORDER — DICYCLOMINE HCL 10 MG PO CAPS: 20.0000 mg | ORAL_CAPSULE | Freq: Once | ORAL | Status: DC

## 2018-05-23 NOTE — ED Provider Notes (Signed)
[facility] EMERGENCY DEPARTMENT Provider Note   CSN: [id number] Arrival date & time: 05/23/18  0208     History   Chief Complaint Chief Complaint  Patient presents with  . Abdominal Pain    HPI Samuel Cole is a 47 y.o. male.  Patient presents with complaint of intermittent, periumbilical abdominal cramping that started several hours ago. He has no pain in between episodes and states each episode lasts for a few minutes before it goes away. No fever, nausea, vomiting, constipation or diarrhea. He has no pain right now. He denies any medical history and takes no regular medications. No history of similar symptoms in the past. No aggravating or alleviating factors.   The history is provided by the patient. No language interpreter was used.    History reviewed. No pertinent past medical history.  Patient Active Problem List   Diagnosis Date Noted  . Protein calorie malnutrition (HCC) 02/10/2012  . Acute respiratory failure with hypoxia (HCC) 02/09/2012  . Epiglottitis 02/09/2012  . CAP (community acquired pneumonia) 02/09/2012    History reviewed. No pertinent surgical history.      Home Medications    Prior to Admission medications   Not on File    Family History No family history on file.  Social History Social History   Tobacco Use  . Smoking status: Former Smoker    Packs/day: 1.00    Years: 20.00    Pack years: 20.00    Types: Cigarettes    Last attempt to quit: 02/08/2012    Years since quitting: 6.2  . Smokeless tobacco: Never Used  Substance Use Topics  . Alcohol use: Yes    Alcohol/week: 1.0 standard drinks    Types: 1 Cans of beer per week  . Drug use: Yes    Frequency: 1.0 times per week    Types: Marijuana     Allergies   Patient has no known allergies.   Review of Systems Review of Systems  Constitutional: Negative for chills and fever.  HENT: Negative.   Respiratory: Negative.   Cardiovascular: Negative.     Gastrointestinal: Positive for abdominal pain. Negative for constipation, diarrhea, nausea and vomiting.  Musculoskeletal: Negative.  Negative for back pain.  Skin: Negative.   Neurological: Negative.      Physical Exam Updated Vital Signs BP 124/83   Pulse 68   Temp 97.7 F (36.5 C)   Resp (!) 24   Ht 5\' 9"  (1.753 m)   Wt 70.3 kg   SpO2 100%   BMI 22.89 kg/m   Physical Exam  Constitutional: He appears well-developed and well-nourished.  HENT:  Head: Normocephalic.  Neck: Normal range of motion. Neck supple.  Cardiovascular: Normal rate and regular rhythm.  Pulmonary/Chest: Effort normal and breath sounds normal.  Abdominal: Soft. Bowel sounds are normal. He exhibits no distension. There is no tenderness. There is no rebound and no guarding.  Musculoskeletal: Normal range of motion.  Neurological: He is alert. No cranial nerve deficit.  Skin: Skin is warm and dry. No rash noted.  Psychiatric: He has a normal mood and affect.     ED Treatments / Results  Labs (all labs ordered are listed, but only abnormal results are displayed) Labs Reviewed  LIPASE, BLOOD - Abnormal; Notable for the following components:      Result Value   Lipase 312 (*)    All other components within normal limits  COMPREHENSIVE METABOLIC PANEL - Abnormal; Notable for the following components:  Potassium 3.2 (*)    Glucose, Bld 143 (*)    AST 71 (*)    All other components within normal limits  URINALYSIS, ROUTINE W REFLEX MICROSCOPIC - Abnormal; Notable for the following components:   APPearance HAZY (*)    Hgb urine dipstick SMALL (*)    Leukocytes, UA TRACE (*)    All other components within normal limits  CBC   Results for orders placed or performed during the hospital encounter of 05/23/18  Lipase, blood  Result Value Ref Range   Lipase 312 (H) 11 - 51 U/L  Comprehensive metabolic panel  Result Value Ref Range   Sodium 136 135 - 145 mmol/L   Potassium 3.2 (L) 3.5 - 5.1 mmol/L    Chloride 103 98 - 111 mmol/L   CO2 24 22 - 32 mmol/L   Glucose, Bld 143 (H) 70 - 99 mg/dL   BUN 7 6 - 20 mg/dL   Creatinine, Ser 1.61 0.61 - 1.24 mg/dL   Calcium 09.6 8.9 - 04.5 mg/dL   Total Protein 7.1 6.5 - 8.1 g/dL   Albumin 3.9 3.5 - 5.0 g/dL   AST 71 (H) 15 - 41 U/L   ALT 32 0 - 44 U/L   Alkaline Phosphatase 63 38 - 126 U/L   Total Bilirubin 1.0 0.3 - 1.2 mg/dL   GFR calc non Af Amer >60 >60 mL/min   GFR calc Af Amer >60 >60 mL/min   Anion gap 9 5 - 15  CBC  Result Value Ref Range   WBC 9.2 4.0 - 10.5 K/uL   RBC 4.55 4.22 - 5.81 MIL/uL   Hemoglobin 14.5 13.0 - 17.0 g/dL   HCT 40.9 81.1 - 91.4 %   MCV 91.4 80.0 - 100.0 fL   MCH 31.9 26.0 - 34.0 pg   MCHC 34.9 30.0 - 36.0 g/dL   RDW 78.2 95.6 - 21.3 %   Platelets 272 150 - 400 K/uL   nRBC 0.0 0.0 - 0.2 %  Urinalysis, Routine w reflex microscopic  Result Value Ref Range   Color, Urine YELLOW YELLOW   APPearance HAZY (A) CLEAR   Specific Gravity, Urine 1.019 1.005 - 1.030   pH 6.0 5.0 - 8.0   Glucose, UA NEGATIVE NEGATIVE mg/dL   Hgb urine dipstick SMALL (A) NEGATIVE   Bilirubin Urine NEGATIVE NEGATIVE   Ketones, ur NEGATIVE NEGATIVE mg/dL   Protein, ur NEGATIVE NEGATIVE mg/dL   Nitrite NEGATIVE NEGATIVE   Leukocytes, UA TRACE (A) NEGATIVE   RBC / HPF 0-5 0 - 5 RBC/hpf   WBC, UA 0-5 0 - 5 WBC/hpf   Bacteria, UA NONE SEEN NONE SEEN   Squamous Epithelial / LPF 0-5 0 - 5   Mucus PRESENT    Ca Oxalate Crys, UA PRESENT     EKG None  Radiology No results found.  Procedures Procedures (including critical care time)  Medications Ordered in ED Medications  metoCLOPramide (REGLAN) tablet 10 mg (10 mg Oral Given 05/23/18 0430)     Initial Impression / Assessment and Plan / ED Course  I have reviewed the triage vital signs and the nursing notes.  Pertinent labs & imaging results that were available during my care of the patient were reviewed by me and considered in my medical decision making (see chart for  details).     Patient to ED with intermittent abdominal cramping without other symptoms.   VSS, afebrile. He has a benign abdominal exam without any tenderness to light  or deep palpation.   Labs are significant for mildly elevated lipase. He reports he is a regular alcohol drinker but denies history of pancreatitis. He is not vomiting. There is no tenderness of the LUQ or of the RUQ. Discussed the elevated lipase and implications with the patient. Doubt significant pancreatitis. Doubt gall bladder disease.   Will start on Bentyl for symptomatic treatment. Encouraged him to return if symptoms worsen. He is given a Pharmacist, hospital for both primary care and adult substance abuse counseling.   Final Clinical Impressions(s) / ED Diagnoses   Final diagnoses:  None   1. Periumbilical abdominal cramping 2. Mildly elevated lipase  ED Discharge Orders    None       Elpidio Anis, PA-C 05/25/18 1340    Geoffery Lyons, MD 05/25/18 2304

## 2018-05-23 NOTE — ED Notes (Signed)
Pt. Did not have any further questions when discussing discharge paperwork. 

## 2018-05-23 NOTE — Discharge Instructions (Addendum)
Take Bentyl for abdominal cramping. Return here if you develop a fever, severe abdominal pain (especially localized to the left upper abdomen), have vomiting or if you have new concerns. You should get a primary care provider for routine concerns. A resource list has been provided for you. Your pancreas enzymes are a little elevated tonight. This is likely due to your alcohol use and can become much worse. Try to stop drinking and follow up if you need assistance.

## 2018-05-23 NOTE — ED Triage Notes (Signed)
abd cramps for several hours no n v or diarrhea

## 2018-06-12 ENCOUNTER — Ambulatory Visit (HOSPITAL_COMMUNITY)
Admission: EM | Admit: 2018-06-12 | Discharge: 2018-06-12 | Disposition: A | Discharge status: Home / Self Care | Payer: Self-pay | Attending: Family Medicine | Admitting: Family Medicine

## 2018-06-12 ENCOUNTER — Encounter (HOSPITAL_COMMUNITY): Payer: Self-pay | Admitting: Emergency Medicine

## 2018-06-12 DIAGNOSIS — R369 Urethral discharge, unspecified: Secondary | ICD-10-CM

## 2018-06-12 DIAGNOSIS — Z87891 Personal history of nicotine dependence: Secondary | ICD-10-CM | POA: Insufficient documentation

## 2018-06-12 DIAGNOSIS — Z113 Encounter for screening for infections with a predominantly sexual mode of transmission: Secondary | ICD-10-CM

## 2018-06-12 DIAGNOSIS — N4889 Other specified disorders of penis: Secondary | ICD-10-CM

## 2018-06-12 DIAGNOSIS — Z711 Person with feared health complaint in whom no diagnosis is made: Secondary | ICD-10-CM | POA: Insufficient documentation

## 2018-06-12 DIAGNOSIS — A599 Trichomoniasis, unspecified: Secondary | ICD-10-CM | POA: Insufficient documentation

## 2018-06-12 DIAGNOSIS — R300 Dysuria: Secondary | ICD-10-CM | POA: Insufficient documentation

## 2018-06-12 DIAGNOSIS — Z202 Contact with and (suspected) exposure to infections with a predominantly sexual mode of transmission: Secondary | ICD-10-CM

## 2018-06-12 LAB — POCT URINALYSIS DIP (DEVICE)
BILIRUBIN URINE: NEGATIVE
GLUCOSE, UA: NEGATIVE mg/dL
Ketones, ur: NEGATIVE mg/dL
LEUKOCYTES UA: NEGATIVE
NITRITE: NEGATIVE
Protein, ur: NEGATIVE mg/dL
Specific Gravity, Urine: 1.025 (ref 1.005–1.030)
UROBILINOGEN UA: 0.2 mg/dL (ref 0.0–1.0)
pH: 5.5 (ref 5.0–8.0)

## 2018-06-12 MED ORDER — METRONIDAZOLE 500 MG PO TABS: 500.0000 mg | ORAL_TABLET | Freq: Two times a day (BID) | ORAL | 0 refills | Status: AC

## 2018-06-12 NOTE — ED Triage Notes (Signed)
[facility] he tested positive for trich was given flagyl for it, states he still is having symptoms. Denies having sex with anyone since thing. Pt c/o itchy feeling on the inside of his penis and irritation when he urinates.

## 2018-06-12 NOTE — ED Provider Notes (Signed)
[facility]    CSN: [id number] Arrival date & time: 06/12/18  1153     History   Chief Complaint Chief Complaint  Patient presents with  . SEXUALLY TRANSMITTED DISEASE    HPI Samuel Cole is a 47 y.o. male.   Samuel Cole presents with complaints of persistent penile symptoms. States he developed penile symptoms of some discomfort and itching, worse with urination, which started approximately 3 weeks ago. He was [facility] the [facility], tested positive for trichomonas, and was treated with one time course of antibiotics. He states that symptoms only briefly and mildly improved, but then returned approximately 1.5 weeks ago. He has not been sexually active since. He has had some opaque penile discharge. Occasionally has urinary frequency. No pelvic pain. No back pain. No fevers or chills. Denies sores, lesions, scrotal redness or swelling. States had similar years ago but nothing recently. Without contributing medical history.      ROS per HPI.      History reviewed. No pertinent past medical history.  Patient Active Problem List   Diagnosis Date Noted  . Protein calorie malnutrition (HCC) 02/10/2012  . Acute respiratory failure with hypoxia (HCC) 02/09/2012  . Epiglottitis 02/09/2012  . CAP (community acquired pneumonia) 02/09/2012    History reviewed. No pertinent surgical history.     Home Medications    Prior to Admission medications   Medication Sig Start Date End Date Taking? Authorizing Provider  dicyclomine (BENTYL) 20 MG tablet Take 1 tablet (20 mg total) by mouth 2 (two) times daily. Patient not taking: Reported on 06/12/2018 05/23/18   Elpidio Anis, PA-C  metroNIDAZOLE (FLAGYL) 500 MG tablet Take 1 tablet (500 mg total) by mouth 2 (two) times daily for 7 days. 06/12/18 06/19/18  Georgetta Haber, NP    Family History No family history on file.  Social History Social History   Tobacco Use  . Smoking status: Former Smoker    Packs/day: 1.00      Years: 20.00    Pack years: 20.00    Types: Cigarettes    Last attempt to quit: 02/08/2012    Years since quitting: 6.3  . Smokeless tobacco: Never Used  Substance Use Topics  . Alcohol use: Yes    Alcohol/week: 1.0 standard drinks    Types: 1 Cans of beer per week  . Drug use: Yes    Frequency: 1.0 times per week    Types: Marijuana     Allergies   Patient has no known allergies.   Review of Systems Review of Systems   Physical Exam Triage Vital Signs ED Triage Vitals  Enc Vitals Group     BP 06/12/18 1241 (!) 116/46     Pulse Rate 06/12/18 1241 63     Resp 06/12/18 1241 18     Temp 06/12/18 1241 97.8 F (36.6 C)     Temp src --      SpO2 06/12/18 1241 100 %     Weight --      Height --      Head Circumference --      Peak Flow --      Pain Score 06/12/18 1243 0     Pain Loc --      Pain Edu? --      Excl. in GC? --    No data found.  Updated Vital Signs BP (!) 116/46   Pulse 63   Temp 97.8 F (36.6 C)   Resp 18  SpO2 100%    Physical Exam  Constitutional: He is oriented to person, place, and time. He appears well-developed and well-nourished.  Cardiovascular: Normal rate and regular rhythm.  Pulmonary/Chest: Effort normal and breath sounds normal.  Abdominal: Soft. There is no tenderness. There is no rigidity, no rebound, no guarding, no CVA tenderness, no tenderness at McBurney's point and negative Murphy's sign.  Denies scrotal redness, swelling, pain; denies sores or lesions; gu exam deferred   Neurological: He is alert and oriented to person, place, and time.  Skin: Skin is warm and dry.     UC Treatments / Results  Labs (all labs ordered are listed, but only abnormal results are displayed) Labs Reviewed  POCT URINALYSIS DIP (DEVICE) - Abnormal; Notable for the following components:      Result Value   Hgb urine dipstick SMALL (*)    All other components within normal limits  URINE CULTURE  URINE CYTOLOGY ANCILLARY ONLY     EKG None  Radiology No results found.  Procedures Procedures (including critical care time)  Medications Ordered in UC Medications - No data to display  Initial Impression / Assessment and Plan / UC Course  I have reviewed the triage vital signs and the nursing notes.  Pertinent labs & imaging results that were available during my care of the patient were reviewed by me and considered in my medical decision making (see chart for details).     Small hgb to urine, culture pending. Will start week long course of flagyl assuming possible inadequate treatment of trichomonas previously. Safe sex practices encouraged. Return precautions provided. Patient verbalized understanding and agreeable to plan.   Final Clinical Impressions(s) / UC Diagnoses   Final diagnoses:  Dysuria  Concern about STD in male without diagnosis  Infection due to trichomonas     Discharge Instructions     We will provide you with a seven day course of antibiotics which should cover trichomonas, assuming it was inadequately treated prior.   Do not drink alcohol while taking. Testing for this is pending. Will notify you of any positive findings and if any changes to treatment are needed.  You may monitor your results on your MyChart online as well.   Please withhold from intercourse for the next week. Please use condoms to prevent STD's.   If symptoms worsen or do not improve in the next week to return to be seen or to follow up with your PCP.     ED Prescriptions    Medication Sig Dispense Auth. Provider   metroNIDAZOLE (FLAGYL) 500 MG tablet Take 1 tablet (500 mg total) by mouth 2 (two) times daily for 7 days. 14 tablet Georgetta Haber, NP     Controlled Substance Prescriptions [place] Controlled Substance Registry consulted? Not Applicable   Georgetta Haber, NP 06/12/18 1329

## 2018-06-12 NOTE — Discharge Instructions (Signed)
We will provide you with a seven day course of antibiotics which should cover trichomonas, assuming it was inadequately treated prior.   Do not drink alcohol while taking. Testing for this is pending. Will notify you of any positive findings and if any changes to treatment are needed.  You may monitor your results on your MyChart online as well.   Please withhold from intercourse for the next week. Please use condoms to prevent STD's.   If symptoms worsen or do not improve in the next week to return to be seen or to follow up with your PCP.

## 2018-06-13 LAB — URINE CULTURE: Culture: NO GROWTH

## 2018-06-13 LAB — URINE CYTOLOGY ANCILLARY ONLY
CHLAMYDIA, DNA PROBE: NEGATIVE
NEISSERIA GONORRHEA: NEGATIVE
Trichomonas: NEGATIVE
